# Patient Record
Sex: Male | Born: 1940 | Race: White | Hispanic: No | Marital: Married | Smoking: Never smoker
Health system: Southern US, Community
[De-identification: ages and names within clinical notes are randomized; demographics above are authoritative.]

## PROBLEM LIST (undated history)

## (undated) DIAGNOSIS — E079 Disorder of thyroid, unspecified: Secondary | ICD-10-CM

## (undated) DIAGNOSIS — C801 Malignant (primary) neoplasm, unspecified: Secondary | ICD-10-CM

## (undated) HISTORY — PX: GASTROSTOMY W/ FEEDING TUBE: SUR642

## (undated) HISTORY — PX: TRACHEOSTOMY: SUR1362

---

## 2016-01-17 ENCOUNTER — Encounter (HOSPITAL_BASED_OUTPATIENT_CLINIC_OR_DEPARTMENT_OTHER): Payer: Self-pay | Admitting: Emergency Medicine

## 2016-01-17 ENCOUNTER — Emergency Department (HOSPITAL_BASED_OUTPATIENT_CLINIC_OR_DEPARTMENT_OTHER)
Admission: EM | Admit: 2016-01-17 | Discharge: 2016-01-17 | Disposition: A | Discharge status: Home / Self Care | Payer: Medicare Other | Attending: Emergency Medicine | Admitting: Emergency Medicine

## 2016-01-17 ENCOUNTER — Emergency Department (HOSPITAL_BASED_OUTPATIENT_CLINIC_OR_DEPARTMENT_OTHER): Payer: Medicare Other

## 2016-01-17 DIAGNOSIS — Z792 Long term (current) use of antibiotics: Secondary | ICD-10-CM | POA: Insufficient documentation

## 2016-01-17 DIAGNOSIS — R05 Cough: Secondary | ICD-10-CM | POA: Diagnosis not present

## 2016-01-17 DIAGNOSIS — Z79899 Other long term (current) drug therapy: Secondary | ICD-10-CM | POA: Insufficient documentation

## 2016-01-17 DIAGNOSIS — Z859 Personal history of malignant neoplasm, unspecified: Secondary | ICD-10-CM | POA: Diagnosis not present

## 2016-01-17 DIAGNOSIS — Z79891 Long term (current) use of opiate analgesic: Secondary | ICD-10-CM | POA: Diagnosis not present

## 2016-01-17 DIAGNOSIS — R509 Fever, unspecified: Secondary | ICD-10-CM | POA: Diagnosis present

## 2016-01-17 DIAGNOSIS — R0981 Nasal congestion: Secondary | ICD-10-CM | POA: Diagnosis not present

## 2016-01-17 DIAGNOSIS — R059 Cough, unspecified: Secondary | ICD-10-CM

## 2016-01-17 HISTORY — DX: Malignant (primary) neoplasm, unspecified: C80.1

## 2016-01-17 HISTORY — DX: Disorder of thyroid, unspecified: E07.9

## 2016-01-17 LAB — CBC WITH DIFFERENTIAL/PLATELET
BASOS ABS: 0 10*3/uL (ref 0.0–0.1)
BASOS PCT: 0 %
EOS ABS: 0.1 10*3/uL (ref 0.0–0.7)
Eosinophils Relative: 4 %
HCT: 30 % — ABNORMAL LOW (ref 39.0–52.0)
HEMOGLOBIN: 9.7 g/dL — AB (ref 13.0–17.0)
Lymphocytes Relative: 12 %
Lymphs Abs: 0.5 10*3/uL — ABNORMAL LOW (ref 0.7–4.0)
MCH: 31.5 pg (ref 26.0–34.0)
MCHC: 32.3 g/dL (ref 30.0–36.0)
MCV: 97.4 fL (ref 78.0–100.0)
Monocytes Absolute: 0.3 10*3/uL (ref 0.1–1.0)
Monocytes Relative: 8 %
NEUTROS PCT: 76 %
Neutro Abs: 3.1 10*3/uL (ref 1.7–7.7)
Platelets: 176 10*3/uL (ref 150–400)
RBC: 3.08 MIL/uL — AB (ref 4.22–5.81)
RDW: 12 % (ref 11.5–15.5)
WBC: 4.1 10*3/uL (ref 4.0–10.5)

## 2016-01-17 LAB — BASIC METABOLIC PANEL
Anion gap: 5 (ref 5–15)
BUN: 31 mg/dL — ABNORMAL HIGH (ref 6–20)
CHLORIDE: 97 mmol/L — AB (ref 101–111)
CO2: 33 mmol/L — ABNORMAL HIGH (ref 22–32)
CREATININE: 1.03 mg/dL (ref 0.61–1.24)
Calcium: 9 mg/dL (ref 8.9–10.3)
GFR calc non Af Amer: >60 mL/min (ref >60)
Glucose, Bld: 131 mg/dL — ABNORMAL HIGH (ref 65–99)
POTASSIUM: 4.3 mmol/L (ref 3.5–5.1)
SODIUM: 135 mmol/L (ref 135–145)

## 2016-01-17 NOTE — ED Provider Notes (Signed)
[facility] DEPT [facility] Provider Note   CSN: [id number] Arrival date & time: 01/17/16  1126     History   Chief Complaint Chief Complaint  Patient presents with  . Fever    HPI Cristian Harding is a 75 y.o. male.  The history is provided by the patient.  Fever   This is a new problem. The current episode started 2 days ago. The problem occurs constantly. The problem has been resolved. The maximum temperature noted was 100 to 100.9 F. The temperature was taken using an oral thermometer. Associated symptoms include congestion (improving) and cough. Pertinent negatives include no chest pain, no diarrhea, no vomiting, no headaches and no sore throat. He has tried acetaminophen for the symptoms. The treatment provided significant relief.   Pt treated for pneumonia by PMD 2 weeks ago, completed 6 of 9 days of antibiotics before having to stop the medications as they were clogged his feeding tube.  Past few days pneumonia symptoms have returned per pt and wife (fever and increased brown mucous), which have significantly improved as of today.  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  Past Medical History:  Diagnosis Date  . Cancer ([place])   . Thyroid disease     There are no active problems to display for this patient.   Past Surgical History:  Procedure Laterality Date  . GASTROSTOMY W/ FEEDING TUBE    . TRACHEOSTOMY         Home Medications    Prior to Admission medications   Medication Sig Start Date End Date Taking? Authorizing Provider  acetaminophen (TYLENOL) 160 MG/5ML liquid Take by mouth every 4 (four) hours as needed for fever.   Yes Historical Provider, MD  ciprofloxacin (CIPRO) 500 MG/5ML (10%) suspension Take 500 mg by mouth 2 (two) times daily.   Yes Historical Provider, MD  diazepam (VALIUM) 10 MG tablet Take 10 mg by mouth 3 (three) times daily.   Yes Historical Provider, MD  dicyclomine  (BENTYL) 10 MG capsule Take 10 mg by mouth 4 (four) times daily -  before meals and at bedtime.   Yes Historical Provider, MD  fentaNYL (DURAGESIC - DOSED MCG/HR) 75 MCG/HR Place 75 mcg onto the skin every 3 (three) days.   Yes Historical Provider, MD  glycopyrrolate (ROBINUL) 1 MG tablet Take 1 mg by mouth 2 (two) times daily.   Yes Historical Provider, MD  levothyroxine (SYNTHROID, LEVOTHROID) 75 MCG tablet Take 75 mcg by mouth daily before breakfast.   Yes Historical Provider, MD  metoCLOPramide (REGLAN) 5 MG/5ML solution Take by mouth 4 (four) times daily -  before meals and at bedtime.   Yes Historical Provider, MD  morphine 10 MG/5ML solution Take by mouth 3 (three) times daily as needed for severe pain.   Yes Historical Provider, MD  promethazine (PHENERGAN) 25 MG tablet Take 25 mg by mouth 2 (two) times daily at 10 am and 4 pm.   Yes Historical Provider, MD  traZODone (DESYREL) 50 MG tablet Take 50 mg by mouth at bedtime.   Yes Historical Provider, MD    Family History No family history on file.  Social History Social History  Substance Use Topics  . Smoking status: Never Smoker  . Smokeless tobacco: Never Used  . Alcohol use No     Allergies   Review of patient's allergies indicates no known allergies.   Review of Systems Review of Systems  Constitutional: Positive for fever. Negative for chills.  HENT: Positive for congestion (improving). Negative for ear pain and sore throat.   Eyes: Negative for pain and visual disturbance.  Respiratory: Positive for cough. Negative for shortness of breath.   Cardiovascular: Negative for chest pain and palpitations.  Gastrointestinal: Negative for abdominal pain, diarrhea and vomiting.  Genitourinary: Negative for dysuria and hematuria.  Musculoskeletal: Negative for arthralgias and back pain.  Skin: Negative for color change and rash.  Neurological: Negative for seizures, syncope and headaches.  All other systems reviewed and are  negative.    Physical Exam Updated Vital Signs BP 115/64 (BP Location: Left Arm)   Pulse 67   Temp 99.1 F (37.3 C) (Rectal)   Resp 16   Ht 5\' 9"  (1.753 m)   Wt 158 lb (71.7 kg)   SpO2 100%   BMI 23.33 kg/m   Physical Exam  Constitutional: He is oriented to person, place, and time. He appears well-developed and well-nourished. No distress.  HENT:  Head: Normocephalic and atraumatic.  Nose: Nose normal.  Eyes: Conjunctivae and EOM are normal. Pupils are equal, round, and reactive to light. Right eye exhibits no discharge. Left eye exhibits no discharge. No scleral icterus.  Neck: Normal range of motion. Neck supple.    Cardiovascular: Normal rate and regular rhythm.  Exam reveals no gallop and no friction rub.   No murmur heard. Pulmonary/Chest: Effort normal. No stridor. No respiratory distress. He has no decreased breath sounds. He has no wheezes. He has no rhonchi. He has rales in the right upper field and the left upper field.  Abdominal: Soft. He exhibits no distension. There is no tenderness.  Musculoskeletal: He exhibits no edema or tenderness.  Neurological: He is alert and oriented to person, place, and time.  Skin: Skin is warm and dry. No rash noted. He is not diaphoretic. No erythema.  Psychiatric: He has a normal mood and affect.  Vitals reviewed.    ED Treatments / Results  Labs (all labs ordered are listed, but only abnormal results are displayed) Labs Reviewed  CBC WITH DIFFERENTIAL/PLATELET - Abnormal; Notable for the following:       Result Value   RBC 3.08 (*)    Hemoglobin 9.7 (*)    HCT 30.0 (*)    Lymphs Abs 0.5 (*)    All other components within normal limits  BASIC METABOLIC PANEL - Abnormal; Notable for the following:    Chloride 97 (*)    CO2 33 (*)    Glucose, Bld 131 (*)    BUN 31 (*)    All other components within normal limits    EKG  EKG Interpretation None       Radiology Dg Chest 2 View  Result Date:  01/17/2016 CLINICAL DATA:  Treated for pneumonia 2 weeks ago, symptoms return last few days. EXAM: CHEST  2 VIEW COMPARISON:  None. FINDINGS: Cardiomediastinal silhouette is unremarkable. There is patchy streaky infiltrate in right upper lobe. Findings suspicious for recurrent pneumonia. Follow-up to resolution is recommended. Degenerative changes thoracic spine. IMPRESSION: There is patchy streaky infiltrate in right upper lobe. Findings suspicious for recurrent pneumonia. Follow-up to resolution is recommended. Electronically Signed   By: Lahoma Crocker M.D.   On: 01/17/2016 12:26    Procedures Procedures (including critical care time)  Medications Ordered in ED Medications - No data to display   Initial Impression / Assessment and Plan / ED Course  I have reviewed the triage vital signs and  the nursing notes.  Pertinent labs & imaging results that were available during my care of the patient were reviewed by me and considered in my medical decision making (see chart for details).  Clinical Course    Chest x-ray with right upper lobe opacity which is consistent with the patient's previously known pneumonia that was treated with Cipro. Labs are reassuring without leukocytosis. Patient without a fever, well-appearing, well-hydrated. Given the fact that his symptoms have improved, the patient, his family, and I discussed treatment options. We've shared decision-making we chose not to pursue antibiotic treatment at this time. Patient was given strict return precautions and instructed to follow up closely with his primary care provider.  The patient is safe for discharge with strict return precautions.   Final Clinical Impressions(s) / ED Diagnoses   Final diagnoses:  Cough   Disposition: Discharge  Condition: Good  I have discussed the results, Dx and Tx plan with the patient who expressed understanding and agree(s) with the plan. Discharge instructions discussed at great length. The  patient was given strict return precautions who verbalized understanding of the instructions. No further questions at time of discharge.    Discharge Medication List as of 01/17/2016  3:07 PM      Follow Up: Charleston Poot, MD [address] [place] [address] [phone]  Schedule an appointment as soon as possible for a visit  in 3-5 days, If symptoms do not improve or  worsen      Fatima Blank, MD 01/17/16 1643

## 2016-01-17 NOTE — ED Triage Notes (Signed)
Pt treated for pneumonia by PMD 2 weeks ago, completed 6 of 9 days of antibiotics before having to stop the medications as they were clogged his feeding tube.  Past few days pneumonia symptoms have returned per pt and wife (fever and increased brown mucous).

## 2016-01-28 ENCOUNTER — Inpatient Hospital Stay (HOSPITAL_BASED_OUTPATIENT_CLINIC_OR_DEPARTMENT_OTHER)
Admission: EM | Admit: 2016-01-28 | Discharge: 2016-02-04 | DRG: 194 | Disposition: A | Discharge status: Home Health | Payer: Medicare Other | Attending: Family Medicine | Admitting: Family Medicine

## 2016-01-28 ENCOUNTER — Encounter (HOSPITAL_BASED_OUTPATIENT_CLINIC_OR_DEPARTMENT_OTHER): Payer: Self-pay | Admitting: Emergency Medicine

## 2016-01-28 ENCOUNTER — Emergency Department (HOSPITAL_BASED_OUTPATIENT_CLINIC_OR_DEPARTMENT_OTHER): Payer: Medicare Other

## 2016-01-28 DIAGNOSIS — I712 Thoracic aortic aneurysm, without rupture: Secondary | ICD-10-CM | POA: Diagnosis present

## 2016-01-28 DIAGNOSIS — Z85819 Personal history of malignant neoplasm of unspecified site of lip, oral cavity, and pharynx: Secondary | ICD-10-CM

## 2016-01-28 DIAGNOSIS — Z931 Gastrostomy status: Secondary | ICD-10-CM | POA: Diagnosis not present

## 2016-01-28 DIAGNOSIS — Z930 Tracheostomy status: Secondary | ICD-10-CM | POA: Diagnosis not present

## 2016-01-28 DIAGNOSIS — Z9221 Personal history of antineoplastic chemotherapy: Secondary | ICD-10-CM | POA: Diagnosis not present

## 2016-01-28 DIAGNOSIS — R339 Retention of urine, unspecified: Secondary | ICD-10-CM

## 2016-01-28 DIAGNOSIS — K029 Dental caries, unspecified: Secondary | ICD-10-CM | POA: Diagnosis present

## 2016-01-28 DIAGNOSIS — R059 Cough, unspecified: Secondary | ICD-10-CM

## 2016-01-28 DIAGNOSIS — K9423 Gastrostomy malfunction: Secondary | ICD-10-CM

## 2016-01-28 DIAGNOSIS — J189 Pneumonia, unspecified organism: Secondary | ICD-10-CM | POA: Diagnosis not present

## 2016-01-28 DIAGNOSIS — Z79899 Other long term (current) drug therapy: Secondary | ICD-10-CM

## 2016-01-28 DIAGNOSIS — I10 Essential (primary) hypertension: Secondary | ICD-10-CM | POA: Diagnosis present

## 2016-01-28 DIAGNOSIS — N179 Acute kidney failure, unspecified: Secondary | ICD-10-CM | POA: Diagnosis not present

## 2016-01-28 DIAGNOSIS — Z923 Personal history of irradiation: Secondary | ICD-10-CM

## 2016-01-28 DIAGNOSIS — Z792 Long term (current) use of antibiotics: Secondary | ICD-10-CM | POA: Diagnosis not present

## 2016-01-28 DIAGNOSIS — I719 Aortic aneurysm of unspecified site, without rupture: Secondary | ICD-10-CM | POA: Diagnosis present

## 2016-01-28 DIAGNOSIS — R911 Solitary pulmonary nodule: Secondary | ICD-10-CM | POA: Diagnosis present

## 2016-01-28 DIAGNOSIS — E860 Dehydration: Secondary | ICD-10-CM | POA: Diagnosis present

## 2016-01-28 DIAGNOSIS — R0600 Dyspnea, unspecified: Secondary | ICD-10-CM | POA: Diagnosis not present

## 2016-01-28 DIAGNOSIS — Y95 Nosocomial condition: Secondary | ICD-10-CM | POA: Diagnosis present

## 2016-01-28 DIAGNOSIS — R05 Cough: Secondary | ICD-10-CM

## 2016-01-28 DIAGNOSIS — R0902 Hypoxemia: Secondary | ICD-10-CM | POA: Diagnosis present

## 2016-01-28 DIAGNOSIS — R112 Nausea with vomiting, unspecified: Secondary | ICD-10-CM | POA: Diagnosis present

## 2016-01-28 DIAGNOSIS — R1110 Vomiting, unspecified: Secondary | ICD-10-CM

## 2016-01-28 DIAGNOSIS — R52 Pain, unspecified: Secondary | ICD-10-CM

## 2016-01-28 DIAGNOSIS — G43A0 Cyclical vomiting, not intractable: Secondary | ICD-10-CM | POA: Diagnosis not present

## 2016-01-28 LAB — BASIC METABOLIC PANEL
ANION GAP: 5 (ref 5–15)
BUN: 21 mg/dL — ABNORMAL HIGH (ref 6–20)
CHLORIDE: 98 mmol/L — AB (ref 101–111)
CO2: 35 mmol/L — ABNORMAL HIGH (ref 22–32)
Calcium: 9.5 mg/dL (ref 8.9–10.3)
Creatinine, Ser: 0.89 mg/dL (ref 0.61–1.24)
Glucose, Bld: 140 mg/dL — ABNORMAL HIGH (ref 65–99)
POTASSIUM: 4.1 mmol/L (ref 3.5–5.1)
SODIUM: 138 mmol/L (ref 135–145)

## 2016-01-28 LAB — I-STAT ARTERIAL BLOOD GAS, ED
Acid-Base Excess: 10 mmol/L — ABNORMAL HIGH (ref 0.0–2.0)
Bicarbonate: 33.7 mmol/L — ABNORMAL HIGH (ref 20.0–28.0)
O2 Saturation: 98 %
PCO2 ART: 39.1 mmHg (ref 32.0–48.0)
PH ART: 7.543 — AB (ref 7.350–7.450)
TCO2: 35 mmol/L (ref 0–100)
pO2, Arterial: 98 mmHg (ref 83.0–108.0)

## 2016-01-28 LAB — CBC WITH DIFFERENTIAL/PLATELET
BASOS ABS: 0 10*3/uL (ref 0.0–0.1)
BASOS PCT: 0 %
EOS ABS: 0.1 10*3/uL (ref 0.0–0.7)
Eosinophils Relative: 2 %
HCT: 30 % — ABNORMAL LOW (ref 39.0–52.0)
HEMOGLOBIN: 9.7 g/dL — AB (ref 13.0–17.0)
LYMPHS ABS: 0.5 10*3/uL — AB (ref 0.7–4.0)
Lymphocytes Relative: 17 %
MCH: 31.5 pg (ref 26.0–34.0)
MCHC: 32.3 g/dL (ref 30.0–36.0)
MCV: 97.4 fL (ref 78.0–100.0)
Monocytes Absolute: 0.4 10*3/uL (ref 0.1–1.0)
Monocytes Relative: 11 %
NEUTROS PCT: 70 %
Neutro Abs: 2.3 10*3/uL (ref 1.7–7.7)
Platelets: 153 10*3/uL (ref 150–400)
RBC: 3.08 MIL/uL — AB (ref 4.22–5.81)
RDW: 11.6 % (ref 11.5–15.5)
WBC: 3.3 10*3/uL — AB (ref 4.0–10.5)

## 2016-01-28 LAB — MRSA PCR SCREENING: MRSA by PCR: POSITIVE — AB

## 2016-01-28 LAB — TROPONIN I

## 2016-01-28 MED ORDER — DIAZEPAM 5 MG PO TABS: 10.0000 mg | ORAL_TABLET | Freq: Three times a day (TID) | ORAL | Status: DC

## 2016-01-28 MED ORDER — METOCLOPRAMIDE HCL 5 MG/5ML PO SOLN
5.0000 mg | Freq: Three times a day (TID) | ORAL | Status: DC
  Filled 2016-01-28 (×2): qty 5

## 2016-01-28 MED ORDER — MORPHINE SULFATE 10 MG/5ML PO SOLN: 10.0000 mg | Freq: Three times a day (TID) | ORAL | Status: DC | PRN

## 2016-01-28 MED ORDER — TRAZODONE HCL 50 MG PO TABS
100.0000 mg | ORAL_TABLET | Freq: Every day | ORAL | Status: DC
  Administered 2016-01-28 – 2016-02-03 (×7): 100 mg via JEJUNOSTOMY
  Filled 2016-01-28 (×6): qty 2

## 2016-01-28 MED ORDER — DIAZEPAM 5 MG PO TABS
10.0000 mg | ORAL_TABLET | Freq: Three times a day (TID) | ORAL | Status: DC
  Filled 2016-01-28: qty 2

## 2016-01-28 MED ORDER — METOCLOPRAMIDE HCL 5 MG/5ML PO SOLN
10.0000 mg | Freq: Three times a day (TID) | ORAL | Status: DC
  Administered 2016-01-29 – 2016-02-04 (×20): 10 mg via ORAL
  Filled 2016-01-28 (×21): qty 10

## 2016-01-28 MED ORDER — PROMETHAZINE HCL 25 MG PO TABS: 25.0000 mg | ORAL_TABLET | Freq: Two times a day (BID) | ORAL | Status: DC

## 2016-01-28 MED ORDER — ENOXAPARIN SODIUM 40 MG/0.4ML ~~LOC~~ SOLN
40.0000 mg | SUBCUTANEOUS | Status: DC
  Administered 2016-01-28 – 2016-02-03 (×7): 40 mg via SUBCUTANEOUS
  Filled 2016-01-28 (×7): qty 0.4

## 2016-01-28 MED ORDER — TRAZODONE HCL 50 MG PO TABS
50.0000 mg | ORAL_TABLET | Freq: Every day | ORAL | Status: DC
  Filled 2016-01-28: qty 1

## 2016-01-28 MED ORDER — SODIUM CHLORIDE 0.9 % IV SOLN
1500.0000 mg | Freq: Once | INTRAVENOUS | Status: AC
  Administered 2016-01-28: 1500 mg via INTRAVENOUS
  Filled 2016-01-28: qty 1500

## 2016-01-28 MED ORDER — IPRATROPIUM-ALBUTEROL 0.5-2.5 (3) MG/3ML IN SOLN
3.0000 mL | Freq: Four times a day (QID) | RESPIRATORY_TRACT | Status: DC
  Administered 2016-01-28 – 2016-01-29 (×5): 3 mL via RESPIRATORY_TRACT
  Filled 2016-01-28 (×5): qty 3

## 2016-01-28 MED ORDER — LEVOTHYROXINE SODIUM 100 MCG IV SOLR
50.0000 ug | Freq: Every day | INTRAVENOUS | Status: DC
  Administered 2016-01-29 – 2016-02-04 (×7): 50 ug via INTRAVENOUS
  Filled 2016-01-28 (×7): qty 5

## 2016-01-28 MED ORDER — GLYCOPYRROLATE 1 MG PO TABS
1.0000 mg | ORAL_TABLET | Freq: Two times a day (BID) | ORAL | Status: DC
  Administered 2016-01-28 – 2016-02-04 (×14): 1 mg via ORAL
  Filled 2016-01-28 (×15): qty 1

## 2016-01-28 MED ORDER — FENTANYL 75 MCG/HR TD PT72
75.0000 ug | MEDICATED_PATCH | TRANSDERMAL | Status: DC
  Filled 2016-01-28: qty 1

## 2016-01-28 MED ORDER — DIAZEPAM 5 MG PO TABS
10.0000 mg | ORAL_TABLET | Freq: Three times a day (TID) | ORAL | Status: DC | PRN
  Administered 2016-01-28 – 2016-02-04 (×17): 10 mg
  Filled 2016-01-28 (×16): qty 2

## 2016-01-28 MED ORDER — IPRATROPIUM-ALBUTEROL 0.5-2.5 (3) MG/3ML IN SOLN
3.0000 mL | Freq: Once | RESPIRATORY_TRACT | Status: AC
  Administered 2016-01-28: 3 mL via RESPIRATORY_TRACT
  Filled 2016-01-28: qty 3

## 2016-01-28 MED ORDER — MORPHINE SULFATE (PF) 2 MG/ML IV SOLN
1.0000 mg | INTRAVENOUS | Status: DC | PRN
  Administered 2016-01-28 – 2016-02-01 (×9): 1 mg via INTRAVENOUS
  Filled 2016-01-28 (×9): qty 1

## 2016-01-28 MED ORDER — DIAZEPAM 5 MG PO TABS: 5.0000 mg | ORAL_TABLET | Freq: Once | ORAL | Status: DC

## 2016-01-28 MED ORDER — DICYCLOMINE HCL 10 MG PO CAPS
10.0000 mg | ORAL_CAPSULE | Freq: Three times a day (TID) | ORAL | Status: DC
  Filled 2016-01-28: qty 1

## 2016-01-28 MED ORDER — PANTOPRAZOLE SODIUM 40 MG PO PACK
40.0000 mg | PACK | Freq: Every day | ORAL | Status: DC
  Administered 2016-01-28 – 2016-02-04 (×8): 40 mg
  Filled 2016-01-28 (×8): qty 20

## 2016-01-28 MED ORDER — POLYETHYLENE GLYCOL 3350 17 G PO PACK: 17.0000 g | PACK | Freq: Every day | ORAL | Status: DC | PRN

## 2016-01-28 MED ORDER — ACETAMINOPHEN 160 MG/5ML PO SOLN
325.0000 mg | ORAL | Status: DC | PRN
Start: 2016-01-28 — End: 2016-01-28

## 2016-01-28 MED ORDER — LORAZEPAM 2 MG/ML IJ SOLN
1.0000 mg | Freq: Once | INTRAMUSCULAR | Status: AC
  Administered 2016-01-28: 1 mg via INTRAVENOUS
  Filled 2016-01-28: qty 1

## 2016-01-28 MED ORDER — HYDRALAZINE HCL 20 MG/ML IJ SOLN
10.0000 mg | Freq: Three times a day (TID) | INTRAMUSCULAR | Status: DC | PRN
  Administered 2016-01-28 – 2016-01-31 (×5): 10 mg via INTRAVENOUS
  Filled 2016-01-28 (×5): qty 1

## 2016-01-28 MED ORDER — ONDANSETRON HCL 4 MG/2ML IJ SOLN
4.0000 mg | Freq: Four times a day (QID) | INTRAMUSCULAR | Status: DC | PRN
  Administered 2016-01-29 – 2016-02-03 (×7): 4 mg via INTRAVENOUS
  Filled 2016-01-28 (×7): qty 2

## 2016-01-28 MED ORDER — LEVOTHYROXINE SODIUM 75 MCG PO TABS: 75.0000 ug | ORAL_TABLET | Freq: Every day | ORAL | Status: DC

## 2016-01-28 MED ORDER — TRAZODONE HCL 50 MG PO TABS: 50.0000 mg | ORAL_TABLET | Freq: Every day | ORAL | Status: DC

## 2016-01-28 MED ORDER — VANCOMYCIN HCL IN DEXTROSE 1-5 GM/200ML-% IV SOLN
1000.0000 mg | Freq: Two times a day (BID) | INTRAVENOUS | Status: DC
  Administered 2016-01-29 – 2016-01-30 (×4): 1000 mg via INTRAVENOUS
  Filled 2016-01-28 (×4): qty 200

## 2016-01-28 MED ORDER — IOPAMIDOL (ISOVUE-300) INJECTION 61%
80.0000 mL | Freq: Once | INTRAVENOUS | Status: AC | PRN
  Administered 2016-01-28: 80 mL via INTRAVENOUS

## 2016-01-28 MED ORDER — ACETAMINOPHEN 160 MG/5ML PO SOLN: 325.0000 mg | ORAL | Status: DC | PRN

## 2016-01-28 MED ORDER — PIPERACILLIN-TAZOBACTAM 3.375 G IVPB
3.3750 g | Freq: Three times a day (TID) | INTRAVENOUS | Status: DC
  Administered 2016-01-28 – 2016-01-31 (×8): 3.375 g via INTRAVENOUS
  Filled 2016-01-28 (×8): qty 50

## 2016-01-28 NOTE — ED Notes (Signed)
Patient transported to CT 

## 2016-01-28 NOTE — Progress Notes (Signed)
Called by ER physician at [facility].  75 year old male with past mental history of neck cancer status post radiation and chemotherapy now with tracheostomy and PEG which has been overall stable up until 2 weeks ago r. In that time he is also developed cough which continued to get worse. He was seen by his PCP and started on antibiotics, however after 6/9 courses, medications were not passing through his feeding tube. Pneumonia symptoms which had improved once antibiotics were started, returned approximately 2 days ago. Patient came in the emergency room for further evaluation. At that time he was noted to be hypoxic with oxygen saturations at 75%.  During emergency room evaluation, it was noted that patient was having significant vomiting, which ER suspected possibly from feeding tube or gastric outlet obstruction? One of his episodes was very blood-tinged. Patient required oxygen to keep oxygen saturations elevated. It was agreed patient would need stepdown status. Case was discussed with critical care hospitalist will admit once patient is transferred to [facility]

## 2016-01-28 NOTE — ED Provider Notes (Signed)
[facility] DEPT [facility] Provider Note   CSN: [id number] Arrival date & time: 01/28/16  1042     History   Chief Complaint Chief Complaint  Patient presents with  . Follow-up  . Cough    HPI Cristian Harding is a 75 y.o. male.  The history is provided by the patient. No language interpreter was used.  Cough     Cristian Harding is a 75 y.o. male who presents to the Emergency Department complaining of cough.  He reports 2 weeks of progressive cough and shortness of breath. 2 weeks ago he was seen by his PCP and started on antibiotics and had a recheck in the emergency department for fever was continued on his antibiotics. He had improved only to develop worsening symptoms 3-4 days ago. He reports a worsening cough productive of thick and white sputum. He has increased fatigue and shortness of breath. He denies any fevers, chest pain, leg swelling or pain. He has a history of throat cancer status post radiation that was complicated with tracheostomy and PEG tube placement. He uses a PMV and is on humidified air at home.  Past Medical History:  Diagnosis Date  . Cancer ([place])   . Thyroid disease     Patient Active Problem List   Diagnosis Date Noted  . Hypoxia 01/28/2016    Past Surgical History:  Procedure Laterality Date  . GASTROSTOMY W/ FEEDING TUBE    . TRACHEOSTOMY         Home Medications    Prior to Admission medications   Medication Sig Start Date End Date Taking? Authorizing Provider  acetaminophen (TYLENOL) 160 MG/5ML liquid Take by mouth every 4 (four) hours as needed for fever.    Historical Provider, MD  ciprofloxacin (CIPRO) 500 MG/5ML (10%) suspension Take 500 mg by mouth 2 (two) times daily.    Historical Provider, MD  diazepam (VALIUM) 10 MG tablet Take 10 mg by mouth 3 (three) times daily.    Historical Provider, MD  dicyclomine (BENTYL) 10 MG capsule Take 10 mg by mouth 4 (four) times daily -  before meals and at bedtime.    Historical Provider, MD    fentaNYL (DURAGESIC - DOSED MCG/HR) 75 MCG/HR Place 75 mcg onto the skin every 3 (three) days.    Historical Provider, MD  glycopyrrolate (ROBINUL) 1 MG tablet Take 1 mg by mouth 2 (two) times daily.    Historical Provider, MD  levothyroxine (SYNTHROID, LEVOTHROID) 75 MCG tablet Take 75 mcg by mouth daily before breakfast.    Historical Provider, MD  metoCLOPramide (REGLAN) 5 MG/5ML solution Take by mouth 4 (four) times daily -  before meals and at bedtime.    Historical Provider, MD  morphine 10 MG/5ML solution Take by mouth 3 (three) times daily as needed for severe pain.    Historical Provider, MD  promethazine (PHENERGAN) 25 MG tablet Take 25 mg by mouth 2 (two) times daily at 10 am and 4 pm.    Historical Provider, MD  traZODone (DESYREL) 50 MG tablet Take 50 mg by mouth at bedtime.    Historical Provider, MD    Family History No family history on file.  Social History Social History  Substance Use Topics  . Smoking status: Never Smoker  . Smokeless tobacco: Never Used  . Alcohol use No     Allergies   Review of patient's allergies indicates no known allergies.   Review of Systems Review of Systems  Respiratory: Positive for cough.   All other  systems reviewed and are negative.    Physical Exam Updated Vital Signs BP 160/95 (BP Location: Right Arm)   Pulse 73   Temp 98.3 F (36.8 C) (Oral)   Resp 18   Ht 5\' 9"  (1.753 m)   Wt 158 lb (71.7 kg)   SpO2 96%   BMI 23.33 kg/m   Physical Exam  Constitutional: He is oriented to person, place, and time. He appears well-developed and well-nourished.  HENT:  Head: Normocephalic and atraumatic.  Neck:  Tracheostomy and anterior neck with PMV in place.  Cardiovascular: Normal rate and regular rhythm.   No murmur heard. Pulmonary/Chest: Effort normal. No respiratory distress.  Occasional rhonchi bilaterally  Abdominal: Soft. There is no tenderness. There is no rebound and no guarding.  Musculoskeletal: He exhibits no  edema or tenderness.  Neurological: He is alert and oriented to person, place, and time.  Skin: Skin is warm and dry. There is pallor.  Psychiatric: He has a normal mood and affect. His behavior is normal.  Nursing note and vitals reviewed.    ED Treatments / Results  Labs (all labs ordered are listed, but only abnormal results are displayed) Labs Reviewed  CBC WITH DIFFERENTIAL/PLATELET - Abnormal; Notable for the following:       Result Value   WBC 3.3 (*)    RBC 3.08 (*)    Hemoglobin 9.7 (*)    HCT 30.0 (*)    Lymphs Abs 0.5 (*)    All other components within normal limits  BASIC METABOLIC PANEL - Abnormal; Notable for the following:    Chloride 98 (*)    CO2 35 (*)    Glucose, Bld 140 (*)    BUN 21 (*)    All other components within normal limits  I-STAT ARTERIAL BLOOD GAS, ED - Abnormal; Notable for the following:    pH, Arterial 7.543 (*)    Bicarbonate 33.7 (*)    Acid-Base Excess 10.0 (*)    All other components within normal limits  TROPONIN I    EKG  EKG Interpretation  Date/Time:  Wednesday January 28 2016 12:09:56 EDT Ventricular Rate:  65 PR Interval:    QRS Duration: 71 QT Interval:  404 QTC Calculation: 420 R Axis:   60 Text Interpretation:  Sinus rhythm Abnormal R-wave progression, early transition Borderline ST elevation, inferior leads , baseline wander Confirmed by Hazle Coca [phone]) on 01/28/2016 12:24:10 PM       Radiology Dg Chest 2 View  Result Date: 01/28/2016 CLINICAL DATA:  Pneumonia 2 weeks ago EXAM: CHEST  2 VIEW COMPARISON:  01/17/2016 FINDINGS: Patchy airspace disease in the right upper lobe has nearly resolved. There is minimal ill-defined opacities remaining. Lungs otherwise clear. Low volumes. Normal heart size. No pneumothorax or pleural effusion. IMPRESSION: Nearly resolved right upper lobe pneumonia. This supports benign etiology. Electronically Signed   By: Marybelle Killings M.D.   On: 01/28/2016 11:57   Ct Chest W  Contrast  Result Date: 01/28/2016 CLINICAL DATA:  Reflux with concern for aspiration. Prior tracheostomy. EXAM: CT CHEST WITH CONTRAST TECHNIQUE: Multidetector CT imaging of the chest was performed during intravenous contrast administration. CONTRAST:  75mL ISOVUE-300 IOPAMIDOL (ISOVUE-300) INJECTION 61% COMPARISON:  Chest radiograph January 28, 2016 FINDINGS: Cardiovascular: There is prominence of the ascending thoracic aorta with a measured transverse diameter of 4.4 x 4.4 cm. No thoracic aortic dissection is seen. There are scattered foci of calcification in the aorta. There is mild calcification in the proximal great vessels as  well. There are foci of coronary artery calcification evident. The pericardium is not appreciably thickened. No major vessel pulmonary embolus is demonstrated. Mediastinum/Nodes: Tracheostomy catheter is positioned with the tip in the mid trachea. Thyroid is diminutive without mass evident. There are scattered subcentimeter mediastinal lymph nodes. There is a focal lymph node anterior to the carina on the right measuring 1.4 x 1.2 cm. There is a sub- carinal lymph node measuring 2.0 x 1.7 cm. No other lymph node prominence is evident in the thoracic region. Lungs/Pleura: There is an azygos lobe on the right, an anatomic variant. There is scarring in the apices with localized bronchiolectasis in the apices medially. There is a semi-solid nodular opacity in the posterior segment of the right upper lobe measuring 1.0 x 0.6 cm, seen on axial slice 52 series 4. There is patchy ground-glass type opacity in the anterior segment of the right upper lobe, measuring 1.2 x 0.7 cm, best appreciated on axial slice 30 series 4. There is mild patchy infiltrate in the superior and medial segments of the left lower lobe. There is mild upper and lower lobe bronchiectatic change bilaterally. Upper Abdomen: In the visualized upper abdomen, there is a gastrostomy catheter extending into the stomach,  incompletely visualized. There are low-attenuation adrenal masses bilaterally. The mass on the right measures 2.5 x 2.2 cm. The mass on the left measures 1.9 x 1.7 cm. Visualized upper abdominal structures otherwise appear unremarkable. 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 There are no blastic or lytic bone lesions. IMPRESSION: Patchy infiltrate left lower lobe. Question aspiration given the clinical history. Ground-glass opacity anterior segment right upper lobe measuring 1.2 x 0.7 cm. Semi-solid nodular opacity posterior segment right upper lobe measuring 1.0 x 0.6 cm. Non-contrast chest CT at 3-6 months is recommended. If nodules persist, subsequent management will be based upon the most suspicious nodule(s). This recommendation follows the consensus statement: Guidelines for Management of Incidental Pulmonary Nodules Detected on CT Images: From the Fleischner Society 2017; Radiology 2017; 284:228-243. Mildly prominent lymph nodes in the precarinal and sub- carinal regions. Question reactive etiology. Scarring with bronchiolectasis in both lung apices. There is bronchiectasis in the upper and lower lobe regions as well. Ascending thoracic aorta measures 4.4 x 4.4 cm. No dissection. Recommend annual imaging followup by CTA or MRA. This recommendation follows 2010 ACCF/AHA/AATS/ACR/ASA/SCA/SCAI/SIR/STS/SVM Guidelines for the Diagnosis and Management of Patients with Thoracic Aortic Disease. Circulation. 2010; [id number]. There are foci of calcification in several carotid arteries. Electronically Signed   By: Lowella Grip III M.D.   On: 01/28/2016 15:34    Procedures Procedures (including critical care time)  Medications Ordered in ED Medications  ipratropium-albuterol (DUONEB) 0.5-2.5 (3) MG/3ML nebulizer solution 3 mL (3 mLs Nebulization Given 01/28/16 1155)  LORazepam (ATIVAN) injection 1 mg (1 mg Intravenous Given 01/28/16 1421)  iopamidol  (ISOVUE-300) 61 % injection 80 mL (80 mLs Intravenous Contrast Given 01/28/16 1448)     Initial Impression / Assessment and Plan / ED Course  I have reviewed the triage vital signs and the nursing notes.  Pertinent labs & imaging results that were available during my care of the patient were reviewed by me and considered in my medical decision making (see chart for details).  Clinical Course  Patient with history of tracheostomy and PEG tube placement here with increased shortness of breath and cough. On initial examination patient in no distress with occasional rhonchi. On repeat assessment patient developed severe cough with diaphoresis. His sats  dropped to the low 70s and he had what appeared to be tube feeds coming from his tracheostomy tube. After aggressive suctioning and frequent coughing the patient was able to clear the material and his oxygen sats improved to the low 90s. His PEG tube was suctioned and 500 mL of tube feed material was returned.  Family requests transfer to [facility] where the patient has had prior Oncology care. [facility] was contacted and they do not have beds available at this time. Discussed the case with critical care physician on-call, Dr. Ashok Cordia, and ABG and CT chest were added on.   Discussed with Dr. Maryland Pink at [facility] who accepts the patient in transfer to stepdown unit.   Following discussed with Dr. Maryland Pink pt developed recurrent but briefer episode of severe cough with hypoxia to 70s, suctioned material appeared to be dark and bloody.     Final Clinical Impressions(s) / ED Diagnoses   Final diagnoses:  None    New Prescriptions New Prescriptions   No medications on file     Quintella Reichert, MD 01/29/16 [phone]

## 2016-01-28 NOTE — Progress Notes (Signed)
Pt Fentanyl pact from home on pt Left Shoulder applied by family on 01/27/2016 @ 1230 pm

## 2016-01-28 NOTE — ED Triage Notes (Addendum)
Pt treated for pneumonia 2 weeks ago.  Seen in ED for follow up.   Pt continues to have productive cough.  PCP would not see pt, sent to ED for evaluation.  No fever.  Pt states he has gotten worse, with sob, cough.  Pt states he has to use suction more frequently.  No acute respiratory distress noted.

## 2016-01-28 NOTE — ED Notes (Signed)
Respiratory called to room with a Sat of 75%. Pt suctioned and noted to have thick, tan , tube feeding smell noted coming out of #6 cufeless [place].

## 2016-01-28 NOTE — ED Notes (Signed)
RT called back to room. SpO2 75% on RA. Pt coughing dark brown color secretions out of trach. Pt suctioned. Placed on 40% ATC

## 2016-01-28 NOTE — Progress Notes (Signed)
Pharmacy Antibiotic Note  Kavir Bagwell is a 75 y.o. male admitted on 01/28/2016 with aspiration PNA.  Pharmacy has been consulted for vancomycin/Zosyn dosing.  Pt has a  hx of throat cancer s/p radiation in remission with trach in place, and dysphagia s/p PEG tube placement who was brought to the ED with cc of dyspnea for the past few days. He has been doing more frequent suctioning recently and has been having worsening cough and hoarseness. No chest pain, fever or chills reported. He was treated recently for a pneumonia from (last month).  Plan: Zosyn 3.375g IV q8h (4 hour infusion).  Vancomycin 1500 mg IV x1, then vancomycin 1000 mg IV q12h  Height: 5\' 9"  (175.3 cm) Weight: 158 lb (71.7 kg) IBW/kg (Calculated) : 70.7  Temp (24hrs), Avg:98.9 F (37.2 C), Min:98.3 F (36.8 C), Max:99.5 F (37.5 C)   Recent Labs Lab 01/28/16 1215  WBC 3.3*  CREATININE 0.89    Estimated Creatinine Clearance: 71.7 mL/min (by C-G formula based on SCr of 0.89 mg/dL).    No Known Allergies  Antimicrobials this admission: vancomycin 11/1 >>  Zosyn 11/1 >>   Dose adjustments this admission: ---  Microbiology results: 11/1 Sputum: sent    Thank you for allowing pharmacy to be a part of this patient's care.  Royetta Asal, PharmD, BCPS Pager [phone] 01/28/2016 8:04 PM

## 2016-01-28 NOTE — ED Notes (Signed)
Suction set up in room. #14 F catheters set up at bedside.

## 2016-01-28 NOTE — H&P (Signed)
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 He has been doing more frequent suctioning recently and has been having worsening cough and hoarseness. No chest pain, fever or chills reported. He was treated recently for a pneumonia from (last month). In the ER, his sat was low to 70s and he had two episodes of vomiting and difficulty passing medications through the PEG tube. There is a suspicion that he has gastric outlet obstruction. In one episode , bloody fluid was aspirated but no coffee ground or clots. No abdominal pain or diarrhea. Last BM was yesterday. He gets nothing orally, all intake is via PEG tube and last meal was this morning.   Review of Systems:  CONSTITUTIONAL:     No night sweats.  No fatigue.  No fever. No chills. Eyes:                            No visual changes.  No eye pain.  No eye discharge.   ENT:                              No epistaxis.  No sinus pain.  No sore throat.   No congestion. RESPIRATORY:           +cough.  No wheeze.  No hemoptysis.  +dyspnea CARDIOVASCULAR   :  No chest pains.  No palpitations. GASTROINTESTINAL:  No abdominal pain.  No nausea. +vomiting.  No diarrhea. No constipation.  No hematemesis.  No hematochezia.  No melena. GENITOURINARY:      No urgency.  No frequency.  No dysuria.  No hematuria.  No obstructive symptoms.  No discharge.  No pain.   MUSCULOSKELETAL:  No musculoskeletal pain.  No joint swelling.  No arthritis. NEUROLOGICAL:        No confusion.  No weakness. No headache. No seizure. PSYCHIATRIC:             No depression. No anxiety. No suicidal ideation. SKIN:                             No rashes.   No lesions.  No wounds. ENDOCRINE:                No weight loss.  No polydipsia.  No polyuria.  No polyphagia. HEMATOLOGIC:           No purpura.  No petechiae.  No bleeding.  ALLERGIC                 : No pruritus.  No angioedema Other:  Past Medical and Surgical History:   Past Medical History:  Diagnosis Date  . Cancer ([place])   . Thyroid disease    Past Surgical History:  Procedure Laterality Date  . GASTROSTOMY W/ FEEDING TUBE    . TRACHEOSTOMY      Social History:   reports that he has never smoked. He has never used smokeless tobacco. He reports that he does not drink alcohol. His drug  history is not on file.   No Known Allergies  No family history on file.    Prior to Admission medications   Medication Sig Start Date End Date Taking? Authorizing Provider  acetaminophen (TYLENOL) 160 MG/5ML liquid Take by mouth every 4 (four) hours as needed for fever.    Historical Provider, MD  ciprofloxacin (CIPRO) 500 MG/5ML (10%) suspension Take 500 mg by mouth 2 (two) times daily.    Historical Provider, MD  diazepam (VALIUM) 10 MG tablet Take 10 mg by mouth 3 (three) times daily.    Historical Provider, MD  dicyclomine (BENTYL) 10 MG capsule Take 10 mg by mouth 4 (four) times daily -  before meals and at bedtime.    Historical Provider, MD  fentaNYL (DURAGESIC - DOSED MCG/HR) 75 MCG/HR Place 75 mcg onto the skin every 3 (three) days.    Historical Provider, MD  glycopyrrolate (ROBINUL) 1 MG tablet Take 1 mg by mouth 2 (two) times daily.    Historical Provider, MD  levothyroxine (SYNTHROID, LEVOTHROID) 75 MCG tablet Take 75 mcg by mouth daily before breakfast.    Historical Provider, MD  metoCLOPramide (REGLAN) 5 MG/5ML solution Take by mouth 4 (four) times daily -  before meals and at bedtime.    Historical Provider, MD  morphine 10 MG/5ML solution Take by mouth 3 (three) times daily as needed for severe pain.    Historical Provider, MD  promethazine (PHENERGAN) 25 MG tablet Take  25 mg by mouth 2 (two) times daily at 10 am and 4 pm.    Historical Provider, MD  traZODone (DESYREL) 50 MG tablet Take 50 mg by mouth at bedtime.    Historical Provider, MD    Physical Exam: BP (!) 191/82   Pulse 72   Temp 98.3 F (36.8 C) (Oral)   Resp 11   Ht 5\' 9"  (1.753 m)   Wt 71.7 kg (158 lb)   SpO2 100%   BMI 23.33 kg/m   GENERAL :   Alert and cooperative, and appears to be in no acute distress. HEAD:           normocephalic. EYES:            PERRL, EOMI.   EARS:           hearing grossly intact. NOSE:           No nasal discharge. NECK:          Trach in place  CARDIAC:    Normal S1 and S2. No gallop. No murmurs.  Vascular:     no peripheral edema.  LUNGS:       Crackles and coarse breathing sounds in LUL ABDOMEN: Positive bowel sounds. Soft, nondistended, nontender. No guarding or rebound.     PEG tube in place  MSK:           No joint erythema or tenderness.  EXT           : No significant deformity or joint abnormality. Neuro        : Alert, oriented to person, place, and time. SKIN:            No rash. No lesions. PSYCH:       No hallucination. Patient is not suicidal.          Labs on Admission:  Reviewed.   Radiological Exams on Admission: Dg Chest 2 View  Result Date: 01/28/2016 CLINICAL DATA:  Pneumonia 2 weeks ago EXAM: CHEST  2 VIEW  COMPARISON:  01/17/2016 FINDINGS: Patchy airspace disease in the right upper lobe has nearly resolved. There is minimal ill-defined opacities remaining. Lungs otherwise clear. Low volumes. Normal heart size. No pneumothorax or pleural effusion. IMPRESSION: Nearly resolved right upper lobe pneumonia. This supports benign etiology. Electronically Signed   By: Marybelle Killings M.D.   On: 01/28/2016 11:57   Ct Chest W Contrast  Result Date: 01/28/2016 CLINICAL DATA:  Reflux with concern for aspiration. Prior tracheostomy. EXAM: CT CHEST WITH CONTRAST TECHNIQUE: Multidetector CT imaging of the chest was performed during intravenous  contrast administration. CONTRAST:  51mL ISOVUE-300 IOPAMIDOL (ISOVUE-300) INJECTION 61% COMPARISON:  Chest radiograph January 28, 2016 FINDINGS: Cardiovascular: There is prominence of the ascending thoracic aorta with a measured transverse diameter of 4.4 x 4.4 cm. No thoracic aortic dissection is seen. There are scattered foci of calcification in the aorta. There is mild calcification in the proximal great vessels as well. There are foci of coronary artery calcification evident. The pericardium is not appreciably thickened. No major vessel pulmonary embolus is demonstrated. Mediastinum/Nodes: Tracheostomy catheter is positioned with the tip in the mid trachea. Thyroid is diminutive without mass evident. There are scattered subcentimeter mediastinal lymph nodes. There is a focal lymph node anterior to the carina on the right measuring 1.4 x 1.2 cm. There is a sub- carinal lymph node measuring 2.0 x 1.7 cm. No other lymph node prominence is evident in the thoracic region. Lungs/Pleura: There is an azygos lobe on the right, an anatomic variant. There is scarring in the apices with localized bronchiolectasis in the apices medially. There is a semi-solid nodular opacity in the posterior segment of the right upper lobe measuring 1.0 x 0.6 cm, seen on axial slice 52 series 4. There is patchy ground-glass type opacity in the anterior segment of the right upper lobe, measuring 1.2 x 0.7 cm, best appreciated on axial slice 30 series 4. There is mild patchy infiltrate in the superior and medial segments of the left lower lobe. There is mild upper and lower lobe bronchiectatic change bilaterally. Upper Abdomen: In the visualized upper abdomen, there is a gastrostomy catheter extending into the stomach, incompletely visualized. There are low-attenuation adrenal masses bilaterally. The mass on the right measures 2.5 x 2.2 cm. The mass on the left measures 1.9 x 1.7 cm. Visualized upper abdominal structures otherwise appear  unremarkable. 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 There are no blastic or lytic bone lesions. IMPRESSION: Patchy infiltrate left lower lobe. Question aspiration given the clinical history. Ground-glass opacity anterior segment right upper lobe measuring 1.2 x 0.7 cm. Semi-solid nodular opacity posterior segment right upper lobe measuring 1.0 x 0.6 cm. Non-contrast chest CT at 3-6 months is recommended. If nodules persist, subsequent management will be based upon the most suspicious nodule(s). This recommendation follows the consensus statement: Guidelines for Management of Incidental Pulmonary Nodules Detected on CT Images: From the Fleischner Society 2017; Radiology 2017; 284:228-243. Mildly prominent lymph nodes in the precarinal and sub- carinal regions. Question reactive etiology. Scarring with bronchiolectasis in both lung apices. There is bronchiectasis in the upper and lower lobe regions as well. Ascending thoracic aorta measures 4.4 x 4.4 cm. No dissection. Recommend annual imaging followup by CTA or MRA. This recommendation follows 2010 ACCF/AHA/AATS/ACR/ASA/SCA/SCAI/SIR/STS/SVM Guidelines for the Diagnosis and Management of Patients with Thoracic Aortic Disease. Circulation. 2010; [id number]:1689793. There are foci of calcification in several carotid arteries. Electronically Signed   By: Lowella Grip III M.D.   On:  01/28/2016 15:34     Assessment/Plan  HCAP: Started on van/zosyn Sputum cx sent  May send home on levofloxacin if improving  Suctioning Q1H Duoneb Q6H prn Oxygen therapy  Vomiting:  Could be related to pneumonia / infection vs. Outlet obstruction  NPO Gen surgery consult in am PPI , Zofran prn, morphine prn, switch meds to IV if possible  Hydralazine prn SBP>180   Input & Output: na Lines & Tubes: PIV DVT prophylaxis: [place] enoxaparin  GI prophylaxis: PPI Consultants: Gen surgery  Code Status: Full  Family  Communication: at bedside  Disposition Plan: Pam Drown M.D [facility]

## 2016-01-29 ENCOUNTER — Inpatient Hospital Stay (HOSPITAL_COMMUNITY): Payer: Medicare Other

## 2016-01-29 DIAGNOSIS — J189 Pneumonia, unspecified organism: Secondary | ICD-10-CM | POA: Diagnosis present

## 2016-01-29 DIAGNOSIS — R112 Nausea with vomiting, unspecified: Secondary | ICD-10-CM | POA: Diagnosis present

## 2016-01-29 DIAGNOSIS — K9423 Gastrostomy malfunction: Secondary | ICD-10-CM

## 2016-01-29 LAB — CBC
HCT: 29.7 % — ABNORMAL LOW (ref 39.0–52.0)
Hemoglobin: 9.7 g/dL — ABNORMAL LOW (ref 13.0–17.0)
MCH: 31 pg (ref 26.0–34.0)
MCHC: 32.7 g/dL (ref 30.0–36.0)
MCV: 94.9 fL (ref 78.0–100.0)
PLATELETS: 178 10*3/uL (ref 150–400)
RBC: 3.13 MIL/uL — AB (ref 4.22–5.81)
RDW: 12.4 % (ref 11.5–15.5)
WBC: 6.5 10*3/uL (ref 4.0–10.5)

## 2016-01-29 LAB — COMPREHENSIVE METABOLIC PANEL
ALK PHOS: 125 U/L (ref 38–126)
ALT: 26 U/L (ref 17–63)
AST: 25 U/L (ref 15–41)
Albumin: 3.5 g/dL (ref 3.5–5.0)
Anion gap: 7 (ref 5–15)
BILIRUBIN TOTAL: 0.5 mg/dL (ref 0.3–1.2)
BUN: 18 mg/dL (ref 6–20)
CALCIUM: 9.4 mg/dL (ref 8.9–10.3)
CHLORIDE: 100 mmol/L — AB (ref 101–111)
CO2: 32 mmol/L (ref 22–32)
CREATININE: 0.86 mg/dL (ref 0.61–1.24)
Glucose, Bld: 110 mg/dL — ABNORMAL HIGH (ref 65–99)
Potassium: 4.4 mmol/L (ref 3.5–5.1)
Sodium: 139 mmol/L (ref 135–145)
TOTAL PROTEIN: 7 g/dL (ref 6.5–8.1)

## 2016-01-29 MED ORDER — IPRATROPIUM-ALBUTEROL 0.5-2.5 (3) MG/3ML IN SOLN
3.0000 mL | Freq: Three times a day (TID) | RESPIRATORY_TRACT | Status: DC
  Administered 2016-01-30 – 2016-02-03 (×13): 3 mL via RESPIRATORY_TRACT
  Filled 2016-01-29 (×13): qty 3

## 2016-01-29 MED ORDER — JEVITY 1.5 CAL/FIBER PO LIQD
1000.0000 mL | ORAL | Status: DC
  Administered 2016-01-29: 1000 mL
  Filled 2016-01-29: qty 1000

## 2016-01-29 MED ORDER — FREE WATER
200.0000 mL | Freq: Four times a day (QID) | Status: DC
  Administered 2016-01-29 – 2016-02-02 (×14): 200 mL

## 2016-01-29 MED ORDER — JEVITY 1.5 CAL/FIBER PO LIQD
1000.0000 mL | ORAL | Status: DC
  Administered 2016-01-29 – 2016-01-31 (×3): 1000 mL
  Filled 2016-01-29 (×3): qty 1000

## 2016-01-29 NOTE — Care Management Note (Signed)
Case Management Note  Patient Details  Name: Cristian Harding MRN: [id number] Date of Birth: 1940/09/19  Subjective/Objective:      pna in pt with very complicated medical history q1 hour suctioning, tube feedings, o2,              Action/Plan: Date:  January 29, 2016 Chart reviewed for concurrent status and case management needs. Will continue to follow the patient for status change: Discharge Planning: following for needs Expected discharge date: [id number] Velva Harman, BSN, [place], [place]  Expected Discharge Date:   (unknown)               Expected Discharge Plan:  [place]  In-House Referral:     Discharge planning Services  CM Consult  Post Acute Care Choice:    Choice offered to:     DME Arranged:    DME Agency:     HH Arranged:    [place] Agency:     Status of Service:  In process, will continue to follow  If discussed at Long Length of Stay Meetings, dates discussed:    Additional Comments:  Leeroy Cha, RN 01/29/2016, 11:56 AM

## 2016-01-29 NOTE — Progress Notes (Signed)
Initial Nutrition Assessment  DOCUMENTATION CODES:   Not applicable  INTERVENTION:  - Jevity 1.5 only available in liter bottles (not cans) at this facility. During hospitalization, will order Jevity 1.5 @ 50 mL/hr with 200 mL free water QID to provide 1800 kcal, 77 grams of protein, and 1712 mL free water. - Please re-consult RD prior to d/c if needed.  Home regimen recommendation: 1 can Jevity 1.5 every 3 hours (5 cans/day). Flush with 100 mL free water before and 100 mL free water after each TF bolus.    NUTRITION DIAGNOSIS:   Swallowing difficulty related to dysphagia as evidenced by per patient/family report.  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 He has been doing more frequent suctioning recently and has been having worsening cough and hoarseness. No chest pain, fever or chills reported. He was treated recently for a pneumonia from (last month). In the ER, his sat was low to 70s and he had two episodes of vomiting and difficulty passing medications through the PEG tube. There is a suspicion that he has gastric outlet obstruction. In one episode , bloody fluid was aspirated but no coffee ground or clots. No abdominal pain or diarrhea. Last BM was yesterday. He gets nothing orally, all intake is via PEG tube and last meal was this morning.   Pt seen for consult for TF. BMI indicates normal weight. Pt with trach and PEG at baseline. Pt contributes to the conversation intermittently but wife, who is at bedside, provides information; daughter also at bedside and assists to conveys information provided by RD to pt's  wife.  He does not take anything PO and all nutrition and medications are provided via PEG, per wife's report. Pt has had PEG x8 years and has been on current TF regimen x7 years: 2 cans Jevity 1.5 BID and 1 can Jevity 1.5 once/day (5 cans/day total). This regimen provides 1775 kcal, 76 grams of protein, and 900 mL free water. Between flushes for medications + before and after boluses of TF, pt receives an additional 40 ounce (1183 mL) free water/day. Recently, pt has not been feeling well so he has only been taking 2 cans of Jevity 1.5 BID which provided 1420 kcal, 60 grams of protein, and 720 mL free water.   Wife reports that episode of high gastric residual and vomiting large amount of TF from trach has never occurred. She states that pt does have reflux and that he requires Reglan 30 minutes prior to all TF boluses. He intermittently has some TF-tinged secretions but these are very small in amount. PTA pt's PCP had recommended pt be provided with 1 can of Jevity 1.5 five times/day for 1 week to assess if he "felt better." Wife and pt report that pt did not notice an overt difference with this. Explained that although a difference may not be felt, decreasing TF bolus volume may be beneficial given current dx and the need for resolution of possible (aspiration) PNA.   Discussed continuing 1 can of Jevity 1.5 five times daily at home home until medical personnel feel  that PNA has fully resolved. Pt and wife open to this, but reluctant. Wife repeats above noted information multiple times and does not appear to fully understand that change to TF regimen would likely be temporary until acute illness has resolved. She re-iterates that this occurrence has never happened in the 7 years pt has been on current TF regimen.  Wife reports that pt's weight has been stable over the past few years. No previous weight hx available in the chart. Unable to perform physical assessment d/t conversation and pt being taken by  transport at the end of RD discussion with pt and family.   Medications reviewed; 50 mcg IV Synthroid daily, 10 mg Reglan TID, PRN IV Zofran, 40 mg Protonix daily, PRN Miralax. Labs reviewed; Cl: 100 mmol/L.    Diet Order:  Diet NPO time specified  Skin:  Reviewed, no issues  Last BM:  PTA/unknown  Height:   Ht Readings from Last 1 Encounters:  01/28/16 5\' 9"  (1.753 m)    Weight:   Wt Readings from Last 1 Encounters:  01/28/16 158 lb (71.7 kg)    Ideal Body Weight:  72.73 kg  BMI:  Body mass index is 23.33 kg/m.  Estimated Nutritional Needs:   Kcal:  [id number] (22-26 kcal/kg)  Protein:  65-75 grams  Fluid:  >/= 2 L/day  EDUCATION NEEDS:   No education needs identified at this time    Jarome Matin, MS, RD, LDN Inpatient Clinical Dietitian Pager # [phone] After hours/weekend pager # [phone]

## 2016-01-29 NOTE — Progress Notes (Signed)
Asked to see patient to assure g-tube is working well.  Upon arrival to the room, the patient's g-tube is connected to TFs and is running well.  The x-rays from this morning appear to show his g-tube in good position with no evidence of obstruction.  No intervention warranted for this g-tube at this time.  Maleaha Hughett E 01/29/2016

## 2016-01-29 NOTE — Progress Notes (Addendum)
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 He has been doing more frequent suctioning recently and has been having worsening cough and hoarseness. No chest pain, fever or chills reported. He was treated recently for a pneumonia from (last month). In the ER, his sat was low to 70s and he had two episodes of vomiting and difficulty passing medications through the PEG tube. There is a suspicion that he has gastric outlet obstruction. In one episode , bloody fluid was aspirated but no coffee ground or clots. No abdominal pain or diarrhea. Last BM was yesterday. He gets nothing orally, all intake is via PEG tube and last meal was this morning    Subjective   Patient seen and examined, breathing has improved. Denies any vomiting overnight.   Assessment/Plan:     1. Healthcare associated pneumonia- patchy infiltrate in the left lower lobe, question aspiration as per CT chest. Continue vancomycin and Zosyn. Follow sputum culture, suction when necessary, duo nebs every 6 hours. 2. Vomiting- resolved, patient had large gastric residual, consult IR for PEG tube malfunction. Also check abdominal x-ray to rule out underlying ileus versus SBO. 3. Nutrition- patient's wife says  that she has been giving 2 cans of Jevity twice a day, will consult nutrition for further recommendations regarding giving Jevity at home. 4. Hypertension hydralazine when necessary 5. Pulmonary nodule- CT chest shows pulmonary noduleSemi-solid nodular opacity posterior segment right upper           lobe measuring 1.0 x 0.6 cm. noncontrast CT in 3-6 months is recommended to check pulmonary nodules. 6.        Aortic aneurysm- Ascending thoracic aorta  measures 4.4 x 4.4 cm. No dissection, and will CTA or MRA recommended for screening.     DVT prophylaxis: Lovenox  Code Status: Full code  Family Communication: Discussed with patient's daughter and wife at bedside   Disposition Plan: home in next 24- 48 hrs   Consultants:  None   Procedures:  None   Antibiotics:   Anti-infectives    Start     Dose/Rate Route Frequency Ordered Stop   01/29/16 0800  vancomycin (VANCOCIN) IVPB 1000 mg/200 mL premix     1,000 mg 200 mL/hr over 60 Minutes Intravenous Every 12 hours 01/28/16 1949     01/28/16 2000  piperacillin-tazobactam (ZOSYN) IVPB 3.375 g     3.375 g 12.5 mL/hr over 240 Minutes Intravenous Every 8 hours 01/28/16 1949     01/28/16 2000  vancomycin (VANCOCIN) 1,500 mg in sodium chloride 0.9 % 500 mL IVPB     1,500 mg 250 mL/hr over 120 Minutes Intravenous  Once 01/28/16 1949 01/28/16 2224       Objective   Vitals:   01/29/16 0843 01/29/16 0900 01/29/16 1000 01/29/16 1100  BP:  (!) 158/73 (!) 142/62 109/76  Pulse:  71 67 66  Resp:  17 (!) 24 15  Temp:      TempSrc:      SpO2: 96% 98% 100% 98%  Weight:      Height:        Intake/Output Summary (Last 24 hours) at 01/29/16 1120 Last data filed at 01/29/16 1100  Gross per 24  hour  Intake             1250 ml  Output             1175 ml  Net               75 ml   Filed Weights   01/28/16 1046  Weight: 71.7 kg (158 lb)     Physical Examination:  General exam: Appears calm and comfortable. Respiratory system: Trach collar in place, Clear to auscultation. Respiratory effort normal. Cardiovascular system:  RRR. No  murmurs, rubs, gallops. No pedal edema. GI system: Abdomen is nondistended, soft and nontender. No organomegaly. Peg tube in place. Central nervous system. No focal neurological deficits. 5 x 5 power in all extremities. Skin: No rashes, lesions or ulcers. Psychiatry: Alert, oriented x 3.Judgement and insight appear normal. Affect  normal.    Data Reviewed: I have personally reviewed following labs and imaging studies  CBG: No results for input(s): GLUCAP in the last 168 hours.  CBC:  Recent Labs Lab 01/28/16 1215 01/29/16 0310  WBC 3.3* 6.5  NEUTROABS 2.3  --   HGB 9.7* 9.7*  HCT 30.0* 29.7*  MCV 97.4 94.9  PLT 153 0000000    Basic Metabolic Panel:  Recent Labs Lab 01/28/16 1215 01/29/16 0310  NA 138 139  K 4.1 4.4  CL 98* 100*  CO2 35* 32  GLUCOSE 140* 110*  BUN 21* 18  CREATININE 0.89 0.86  CALCIUM 9.5 9.4    Recent Results (from the past 240 hour(s))  MRSA PCR Screening     Status: Abnormal   Collection Time: 01/28/16  8:45 PM  Result Value Ref Range Status   MRSA by PCR POSITIVE (A) NEGATIVE Final    Comment:        The GeneXpert MRSA Assay (FDA approved for NASAL specimens only), is one component of a comprehensive MRSA colonization surveillance program. It is not intended to diagnose MRSA infection nor to guide or monitor treatment for MRSA infections. RESULT CALLED TO, READ BACK BY AND VERIFIED WITH: BEN MADDOX,RN [id number] @ 2237 BY J SCOTTON      Liver Function Tests:  Recent Labs Lab 01/29/16 0310  AST 25  ALT 26  ALKPHOS 125  BILITOT 0.5  PROT 7.0  ALBUMIN 3.5    Cardiac Enzymes:  Recent Labs Lab 01/28/16 1215  TROPONINI <0.03   BNP (last 3 results) No results for input(s): BNP in the last 8760 hours.  ProBNP (last 3 results) No results for input(s): PROBNP in the last 8760 hours.    Studies: Dg Chest 2 View  Result Date: 01/28/2016 CLINICAL DATA:  Pneumonia 2 weeks ago EXAM: CHEST  2 VIEW COMPARISON:  01/17/2016 FINDINGS: Patchy airspace disease in the right upper lobe has nearly resolved. There is minimal ill-defined opacities remaining. Lungs otherwise clear. Low volumes. Normal heart size. No pneumothorax or pleural effusion. IMPRESSION: Nearly resolved right upper lobe pneumonia. This supports benign etiology. Electronically Signed   By: Marybelle Killings M.D.   On: 01/28/2016 11:57   Ct Chest W Contrast  Result Date: 01/28/2016 CLINICAL DATA:  Reflux with concern for aspiration. Prior tracheostomy. EXAM: CT CHEST WITH CONTRAST TECHNIQUE: Multidetector CT imaging of the chest was performed during intravenous contrast administration. CONTRAST:  39mL ISOVUE-300 IOPAMIDOL (ISOVUE-300) INJECTION 61% COMPARISON:  Chest radiograph January 28, 2016 FINDINGS: Cardiovascular: There is prominence of the ascending thoracic aorta with a measured transverse diameter of 4.4 x 4.4 cm. No thoracic  aortic dissection is seen. There are scattered foci of calcification in the aorta. There is mild calcification in the proximal great vessels as well. There are foci of coronary artery calcification evident. The pericardium is not appreciably thickened. No major vessel pulmonary embolus is demonstrated. Mediastinum/Nodes: Tracheostomy catheter is positioned with the tip in the mid trachea. Thyroid is diminutive without mass evident. There are scattered subcentimeter mediastinal lymph nodes. There is a focal lymph node anterior to the carina on the right measuring 1.4 x 1.2 cm. There is a sub- carinal lymph node measuring 2.0 x 1.7 cm. No other lymph node prominence is evident in the thoracic region. Lungs/Pleura: There is an azygos lobe on the right, an anatomic variant. There is scarring in the apices with localized bronchiolectasis in the apices medially. There is a semi-solid nodular opacity in the posterior segment of the right upper lobe measuring 1.0 x 0.6 cm, seen on axial slice 52 series 4. There is patchy ground-glass type opacity in the anterior segment of the right upper lobe, measuring 1.2 x 0.7 cm, best appreciated on axial slice 30 series 4. There is mild patchy infiltrate in the superior and medial segments of the left lower lobe. There is mild upper and lower lobe bronchiectatic change bilaterally. Upper Abdomen: In the visualized upper abdomen, there is a  gastrostomy catheter extending into the stomach, incompletely visualized. There are low-attenuation adrenal masses bilaterally. The mass on the right measures 2.5 x 2.2 cm. The mass on the left measures 1.9 x 1.7 cm. Visualized upper abdominal structures otherwise appear unremarkable. 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 There are no blastic or lytic bone lesions. IMPRESSION: Patchy infiltrate left lower lobe. Question aspiration given the clinical history. Ground-glass opacity anterior segment right upper lobe measuring 1.2 x 0.7 cm. Semi-solid nodular opacity posterior segment right upper lobe measuring 1.0 x 0.6 cm. Non-contrast chest CT at 3-6 months is recommended. If nodules persist, subsequent management will be based upon the most suspicious nodule(s). This recommendation follows the consensus statement: Guidelines for Management of Incidental Pulmonary Nodules Detected on CT Images: From the Fleischner Society 2017; Radiology 2017; 284:228-243. Mildly prominent lymph nodes in the precarinal and sub- carinal regions. Question reactive etiology. Scarring with bronchiolectasis in both lung apices. There is bronchiectasis in the upper and lower lobe regions as well. Ascending thoracic aorta measures 4.4 x 4.4 cm. No dissection. Recommend annual imaging followup by CTA or MRA. This recommendation follows 2010 ACCF/AHA/AATS/ACR/ASA/SCA/SCAI/SIR/STS/SVM Guidelines for the Diagnosis and Management of Patients with Thoracic Aortic Disease. Circulation. 2010; 121ZK:5694362. There are foci of calcification in several carotid arteries. Electronically Signed   By: Lowella Grip III M.D.   On: 01/28/2016 15:34    Scheduled Meds: . enoxaparin (LOVENOX) injection  40 mg Subcutaneous Q24H  . fentaNYL  75 mcg Transdermal Q72H  . free water  200 mL Per Tube QID  . glycopyrrolate  1 mg Oral BID  . ipratropium-albuterol  3 mL Nebulization Q6H  . levothyroxine   50 mcg Intravenous QAC breakfast  . metoCLOPramide  10 mg Oral TID AC  . pantoprazole sodium  40 mg Per Tube Daily  . piperacillin-tazobactam (ZOSYN)  IV  3.375 g Intravenous Q8H  . traZODone  100 mg Per J Tube QHS  . vancomycin  1,000 mg Intravenous Q12H    Continuous Infusions: . feeding supplement (JEVITY 1.5 CAL/FIBER) 1,000 mL (01/29/16 0830)    Time spent: 25 min  Zarah Carbon S  [facility] Pager [phone]. If 7PM-7AM, please contact night-coverage at [email or web address], Office  [phone]  password [email or web address] 01/29/2016, 11:20 AM  LOS: 1 day

## 2016-01-30 MED ORDER — CHLORHEXIDINE GLUCONATE CLOTH 2 % EX PADS
6.0000 | MEDICATED_PAD | Freq: Every day | CUTANEOUS | Status: DC
  Administered 2016-01-30: 6 via TOPICAL

## 2016-01-30 MED ORDER — FENTANYL 75 MCG/HR TD PT72
75.0000 ug | MEDICATED_PATCH | TRANSDERMAL | Status: DC
  Administered 2016-01-30 – 2016-02-02 (×2): 75 ug via TRANSDERMAL
  Filled 2016-01-30 (×2): qty 1

## 2016-01-30 MED ORDER — PROMETHAZINE HCL 25 MG/ML IJ SOLN
12.5000 mg | Freq: Once | INTRAMUSCULAR | Status: AC
  Administered 2016-01-30: 12.5 mg via INTRAVENOUS
  Filled 2016-01-30: qty 1

## 2016-01-30 MED ORDER — MUPIROCIN 2 % EX OINT
1.0000 "application " | TOPICAL_OINTMENT | Freq: Two times a day (BID) | CUTANEOUS | Status: AC
  Administered 2016-01-30 – 2016-02-03 (×10): 1 via NASAL
  Filled 2016-01-30 (×2): qty 22

## 2016-01-30 MED ORDER — ACETAMINOPHEN 325 MG PO TABS
650.0000 mg | ORAL_TABLET | Freq: Once | ORAL | Status: AC
  Administered 2016-01-30: 650 mg via ORAL
  Filled 2016-01-30: qty 2

## 2016-01-30 NOTE — Progress Notes (Signed)
One time dose 12.5mg  Phenergan IVP given per MD order.    2100:   Pt reporting less nausea.

## 2016-01-30 NOTE — Progress Notes (Signed)
Pt reports increased nausea despite Zofran given at 1830.  Also reporting headache and just feeling "off all over."  Paged hospitalist to try phenergan and tylenol.

## 2016-01-30 NOTE — Progress Notes (Signed)
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 He has been doing more frequent suctioning recently and has been having worsening cough and hoarseness. No chest pain, fever or chills reported. He was treated recently for a pneumonia from (last month). In the ER, his sat was low to 70s and he had two episodes of vomiting and difficulty passing medications through the PEG tube. There is a suspicion that he has gastric outlet obstruction. In one episode , bloody fluid was aspirated but no coffee ground or clots. No abdominal pain or diarrhea. Last BM was yesterday. He gets nothing orally, all intake is via PEG tube and last meal was this morning    Subjective   Patient seen and examined, breathing has improved. Denies any vomiting overnight. Extra-abdominal shows nonobstructive bowel gas pattern, PEG tube functioning fine as per IR.   Assessment/Plan:     1. Healthcare associated pneumonia- patchy infiltrate in the left lower lobe, question aspiration as per CT chest. Continue vancomycin and Zosyn. Follow sputum culture, suction when necessary, duo nebs every 6 hours. 2. Vomiting- resolved, patient had large gastric residual, consulted IR for PEG tube malfunction, PEG tube functioning fine. Also checked abdominal x-ray which showed nonobstructive bowel gas pattern. 3. Nutrition- patient's wife says  that she has been giving 2 cans of Jevity twice a day, nutrition was consulted and Jevity rate increased to 50 mL per hour in the hospital.Home regimen recommendation: 1 can Jevity 1.5 every 3 hours (5 cans/day). Flush with 100 mL free water before and 100 mL free water after each TF bolus.  4. Hypertension hydralazine when  necessary 5. Pulmonary nodule- CT chest shows pulmonary noduleSemi-solid nodular opacity posterior segment right upper           lobe measuring 1.0 x 0.6 cm. noncontrast CT in 3-6 months is recommended to check pulmonary nodules. 6.        Aortic aneurysm- Ascending thoracic aorta measures 4.4 x 4.4 cm. No dissection, and will CTA or MRA recommended for screening.     DVT prophylaxis: Lovenox  Code Status: Full code  Family Communication: Discussed with patient's daughter and wife at bedside  on 01/29/2016  Disposition Plan: home in next 24- 48 hrs   Consultants:  None   Procedures:  None   Antibiotics:   Anti-infectives    Start     Dose/Rate Route Frequency Ordered Stop   01/29/16 0800  vancomycin (VANCOCIN) IVPB 1000 mg/200 mL premix     1,000 mg 200 mL/hr over 60 Minutes Intravenous Every 12 hours 01/28/16 1949     01/28/16 2000  piperacillin-tazobactam (ZOSYN) IVPB 3.375 g     3.375 g 12.5 mL/hr over 240 Minutes Intravenous Every 8 hours 01/28/16 1949     01/28/16 2000  vancomycin (VANCOCIN) 1,500 mg in sodium chloride 0.9 % 500 mL IVPB     1,500 mg 250 mL/hr over 120 Minutes Intravenous  Once 01/28/16 1949 01/28/16 2224       Objective   Vitals:   01/30/16 0500 01/30/16 0700 01/30/16 0800 01/30/16 0806  BP: (!) 155/66  (!) 167/84   Pulse: 78 67 73   Resp: 18 16 20    Temp:  97.9 F (36.6 C)   TempSrc:   Oral   SpO2: 95% 99% 97% 99%  Weight:      Height:        Intake/Output Summary (Last 24 hours) at 01/30/16 0917 Last data filed at 01/30/16 0800  Gross per 24 hour  Intake             1150 ml  Output             1150 ml  Net                0 ml   Filed Weights   01/28/16 1046  Weight: 71.7 kg (158 lb)     Physical Examination:  General exam: Appears calm and comfortable. Respiratory system: Trach collar in place, Clear to auscultation. Respiratory effort normal. Cardiovascular system:  RRR. No  murmurs, rubs, gallops. No pedal edema. GI  system: Abdomen is nondistended, soft and nontender. No organomegaly. Peg tube in place. Central nervous system. No focal neurological deficits. 5 x 5 power in all extremities. Skin: No rashes, lesions or ulcers. Psychiatry: Alert, oriented x 3.Judgement and insight appear normal. Affect normal.    Data Reviewed: I have personally reviewed following labs and imaging studies  CBG: No results for input(s): GLUCAP in the last 168 hours.  CBC:  Recent Labs Lab 01/28/16 1215 01/29/16 0310  WBC 3.3* 6.5  NEUTROABS 2.3  --   HGB 9.7* 9.7*  HCT 30.0* 29.7*  MCV 97.4 94.9  PLT 153 0000000    Basic Metabolic Panel:  Recent Labs Lab 01/28/16 1215 01/29/16 0310  NA 138 139  K 4.1 4.4  CL 98* 100*  CO2 35* 32  GLUCOSE 140* 110*  BUN 21* 18  CREATININE 0.89 0.86  CALCIUM 9.5 9.4    Recent Results (from the past 240 hour(s))  MRSA PCR Screening     Status: Abnormal   Collection Time: 01/28/16  8:45 PM  Result Value Ref Range Status   MRSA by PCR POSITIVE (A) NEGATIVE Final    Comment:        The GeneXpert MRSA Assay (FDA approved for NASAL specimens only), is one component of a comprehensive MRSA colonization surveillance program. It is not intended to diagnose MRSA infection nor to guide or monitor treatment for MRSA infections. RESULT CALLED TO, READ BACK BY AND VERIFIED WITH: BEN MADDOX,RN [id number] @ 2237 BY J SCOTTON      Liver Function Tests:  Recent Labs Lab 01/29/16 0310  AST 25  ALT 26  ALKPHOS 125  BILITOT 0.5  PROT 7.0  ALBUMIN 3.5    Cardiac Enzymes:  Recent Labs Lab 01/28/16 1215  TROPONINI <0.03   BNP (last 3 results) No results for input(s): BNP in the last 8760 hours.  ProBNP (last 3 results) No results for input(s): PROBNP in the last 8760 hours.    Studies: Dg Chest 2 View  Result Date: 01/28/2016 CLINICAL DATA:  Pneumonia 2 weeks ago EXAM: CHEST  2 VIEW COMPARISON:  01/17/2016 FINDINGS: Patchy airspace disease in the right  upper lobe has nearly resolved. There is minimal ill-defined opacities remaining. Lungs otherwise clear. Low volumes. Normal heart size. No pneumothorax or pleural effusion. IMPRESSION: Nearly resolved right upper lobe pneumonia. This supports benign etiology. Electronically Signed   By: Marybelle Killings M.D.   On: 01/28/2016 11:57   Ct Chest W Contrast  Result Date: 01/28/2016 CLINICAL DATA:  Reflux with concern for aspiration. Prior tracheostomy. EXAM: CT CHEST WITH  CONTRAST TECHNIQUE: Multidetector CT imaging of the chest was performed during intravenous contrast administration. CONTRAST:  72mL ISOVUE-300 IOPAMIDOL (ISOVUE-300) INJECTION 61% COMPARISON:  Chest radiograph January 28, 2016 FINDINGS: Cardiovascular: There is prominence of the ascending thoracic aorta with a measured transverse diameter of 4.4 x 4.4 cm. No thoracic aortic dissection is seen. There are scattered foci of calcification in the aorta. There is mild calcification in the proximal great vessels as well. There are foci of coronary artery calcification evident. The pericardium is not appreciably thickened. No major vessel pulmonary embolus is demonstrated. Mediastinum/Nodes: Tracheostomy catheter is positioned with the tip in the mid trachea. Thyroid is diminutive without mass evident. There are scattered subcentimeter mediastinal lymph nodes. There is a focal lymph node anterior to the carina on the right measuring 1.4 x 1.2 cm. There is a sub- carinal lymph node measuring 2.0 x 1.7 cm. No other lymph node prominence is evident in the thoracic region. Lungs/Pleura: There is an azygos lobe on the right, an anatomic variant. There is scarring in the apices with localized bronchiolectasis in the apices medially. There is a semi-solid nodular opacity in the posterior segment of the right upper lobe measuring 1.0 x 0.6 cm, seen on axial slice 52 series 4. There is patchy ground-glass type opacity in the anterior segment of the right upper lobe,  measuring 1.2 x 0.7 cm, best appreciated on axial slice 30 series 4. There is mild patchy infiltrate in the superior and medial segments of the left lower lobe. There is mild upper and lower lobe bronchiectatic change bilaterally. Upper Abdomen: In the visualized upper abdomen, there is a gastrostomy catheter extending into the stomach, incompletely visualized. There are low-attenuation adrenal masses bilaterally. The mass on the right measures 2.5 x 2.2 cm. The mass on the left measures 1.9 x 1.7 cm. Visualized upper abdominal structures otherwise appear unremarkable. 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 There are no blastic or lytic bone lesions. IMPRESSION: Patchy infiltrate left lower lobe. Question aspiration given the clinical history. Ground-glass opacity anterior segment right upper lobe measuring 1.2 x 0.7 cm. Semi-solid nodular opacity posterior segment right upper lobe measuring 1.0 x 0.6 cm. Non-contrast chest CT at 3-6 months is recommended. If nodules persist, subsequent management will be based upon the most suspicious nodule(s). This recommendation follows the consensus statement: Guidelines for Management of Incidental Pulmonary Nodules Detected on CT Images: From the Fleischner Society 2017; Radiology 2017; 284:228-243. Mildly prominent lymph nodes in the precarinal and sub- carinal regions. Question reactive etiology. Scarring with bronchiolectasis in both lung apices. There is bronchiectasis in the upper and lower lobe regions as well. Ascending thoracic aorta measures 4.4 x 4.4 cm. No dissection. Recommend annual imaging followup by CTA or MRA. This recommendation follows 2010 ACCF/AHA/AATS/ACR/ASA/SCA/SCAI/SIR/STS/SVM Guidelines for the Diagnosis and Management of Patients with Thoracic Aortic Disease. Circulation. 2010; 121ZK:5694362. There are foci of calcification in several carotid arteries. Electronically Signed   By: Lowella Grip  III M.D.   On: 01/28/2016 15:34   Dg Abd 2 Views  Result Date: 01/29/2016 CLINICAL DATA:  Status post PEG tube placement. Dyspnea. Symptoms for the last few days. Requiring more frequent suctioning. Cough and hoarseness. Recent pneumonia. Question of gastric outlet obstruction. EXAM: ABDOMEN - 2 VIEW COMPARISON:  None. FINDINGS: No free intraperitoneal air on decubitus view of the abdomen. Gastrostomy tube overlies the left upper quadrant. Bowel gas pattern is nonobstructive. Contrast is identified within the urinary bladder following intravenous contrast given  for CT of the chest yesterday. There is a wedge compression fracture of L1. IMPRESSION: Nonobstructed bowel-gas pattern. Electronically Signed   By: Nolon Nations M.D.   On: 01/29/2016 14:25    Scheduled Meds: . Chlorhexidine Gluconate Cloth  6 each Topical Q0600  . enoxaparin (LOVENOX) injection  40 mg Subcutaneous Q24H  . fentaNYL  75 mcg Transdermal Q72H  . free water  200 mL Per Tube QID  . glycopyrrolate  1 mg Oral BID  . ipratropium-albuterol  3 mL Nebulization TID  . levothyroxine  50 mcg Intravenous QAC breakfast  . metoCLOPramide  10 mg Oral TID AC  . mupirocin ointment  1 application Nasal BID  . pantoprazole sodium  40 mg Per Tube Daily  . piperacillin-tazobactam (ZOSYN)  IV  3.375 g Intravenous Q8H  . traZODone  100 mg Per J Tube QHS  . vancomycin  1,000 mg Intravenous Q12H    Continuous Infusions: . feeding supplement (JEVITY 1.5 CAL/FIBER) 1,000 mL (01/29/16 1400)    Time spent: 25 min  [facility] Pager [phone]. If 7PM-7AM, please contact night-coverage at [email or web address], Office  [phone]  password [email or web address] 01/30/2016, 9:17 AM  LOS: 2 days

## 2016-01-31 MED ORDER — METOPROLOL TARTRATE 25 MG PO TABS
25.0000 mg | ORAL_TABLET | Freq: Two times a day (BID) | ORAL | Status: DC
  Administered 2016-01-31 – 2016-02-04 (×9): 25 mg via ORAL
  Filled 2016-01-31 (×9): qty 1

## 2016-01-31 MED ORDER — HYDRALAZINE HCL 25 MG PO TABS
25.0000 mg | ORAL_TABLET | Freq: Four times a day (QID) | ORAL | Status: DC | PRN
  Administered 2016-02-01: 25 mg
  Filled 2016-01-31: qty 1

## 2016-01-31 MED ORDER — JEVITY 1.5 CAL/FIBER PO LIQD
1000.0000 mL | ORAL | Status: DC
  Administered 2016-02-01: 1000 mL
  Filled 2016-01-31 (×2): qty 1000

## 2016-01-31 MED ORDER — ACETAMINOPHEN 325 MG PO TABS
650.0000 mg | ORAL_TABLET | Freq: Four times a day (QID) | ORAL | Status: DC | PRN
  Administered 2016-01-31: 650 mg
  Filled 2016-01-31: qty 2

## 2016-01-31 MED ORDER — PROMETHAZINE HCL 25 MG/ML IJ SOLN
12.5000 mg | Freq: Once | INTRAMUSCULAR | Status: AC
  Administered 2016-01-31: 12.5 mg via INTRAVENOUS
  Filled 2016-01-31: qty 1

## 2016-01-31 MED ORDER — GUAIFENESIN 100 MG/5ML PO SOLN
20.0000 mL | ORAL | Status: DC
  Administered 2016-01-31 – 2016-02-04 (×25): 400 mg via ORAL
  Filled 2016-01-31: qty 10
  Filled 2016-01-31 (×15): qty 20
  Filled 2016-01-31 (×2): qty 10
  Filled 2016-01-31 (×4): qty 20
  Filled 2016-01-31: qty 10
  Filled 2016-01-31 (×4): qty 20

## 2016-01-31 MED ORDER — GUAIFENESIN ER 600 MG PO TB12: 1200.0000 mg | ORAL_TABLET | Freq: Two times a day (BID) | ORAL | Status: DC

## 2016-01-31 MED ORDER — LEVOFLOXACIN IN D5W 750 MG/150ML IV SOLN
750.0000 mg | INTRAVENOUS | Status: DC
  Administered 2016-01-31: 750 mg via INTRAVENOUS
  Filled 2016-01-31: qty 150

## 2016-01-31 MED ORDER — GUAIFENESIN 100 MG/5ML PO SOLN: 5.0000 mL | ORAL | Status: DC | PRN

## 2016-01-31 NOTE — Progress Notes (Signed)
Pt requested to try condom catheter again.  RN explained that pt still needs to produce urine to not be I&O cath'd and that we would bladder scan prior to that happening.  Pt was able to answer questions and verbalized understanding.  Condom catheter placed.

## 2016-01-31 NOTE — Progress Notes (Signed)
RT placed PT on new ATC setup at 6 lpm for 28%. Sp02 97%, HR 72, RR 14, BP 133/81, BBS cl-dim (RN just suctioned PT). RN aware.

## 2016-01-31 NOTE — Progress Notes (Signed)
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 He has been doing more frequent suctioning recently and has been having worsening cough and hoarseness. No chest pain, fever or chills reported. He was treated recently for a pneumonia from (last month). In the ER, his sat was low to 70s and he had two episodes of vomiting and difficulty passing medications through the PEG tube. There is a suspicion that he has gastric outlet obstruction. In one episode , bloody fluid was aspirated but no coffee ground or clots. No abdominal pain or diarrhea. Last BM was yesterday. He gets nothing orally, all intake is via PEG tube and last meal was this morning.    Subjective   Patient seen and examined,No vomiting. Tolerating tube feeds well. Still has a lot of lung congestion.   Assessment/Plan:     1. Healthcare associated pneumonia- patchy infiltrate in the left lower lobe, question aspiration as per CT chest. Patient was started on  vancomycin and Zosyn. Will switch to IV Levaquin. Robitussin 400 mg every 4 hours. Suction when necessary 2. Vomiting- resolved, patient had large gastric residual, consulted IR for PEG tube malfunction, PEG tube functioning fine. Also checked abdominal x-ray which showed nonobstructive bowel gas pattern. 3. Nutrition- patient's wife says  that she has been giving 2 cans of Jevity twice a day, nutrition was consulted and Jevity rate increased to 50 mL per hour in the hospital.Home regimen recommendation: 1 can Jevity 1.5 every 3 hours (5 cans/day). Flush with 100 mL free water before and 100 mL free water after each TF bolus.  4. Hypertension hydralazine when necessary 5. Pulmonary nodule- CT chest shows  pulmonary noduleSemi-solid nodular opacity posterior segment right upper           lobe measuring 1.0 x 0.6 cm. noncontrast CT in 3-6 months is recommended to check pulmonary nodules. 6.        Aortic aneurysm- Ascending thoracic aorta measures 4.4 x 4.4 cm. No dissection, and will CTA or MRA recommended for screening.     DVT prophylaxis: Lovenox  Code Status: Full code  Family Communication: Discussed with patient's daughter and wife at bedside  on 01/29/2016  Disposition Plan: home in next 24- 48 hrs   Consultants:  None   Procedures:  None   Antibiotics:   Anti-infectives    Start     Dose/Rate Route Frequency Ordered Stop   01/31/16 1000  levofloxacin (LEVAQUIN) IVPB 750 mg     750 mg 100 mL/hr over 90 Minutes Intravenous Every 24 hours 01/31/16 0830     01/29/16 0800  vancomycin (VANCOCIN) IVPB 1000 mg/200 mL premix  Status:  Discontinued     1,000 mg 200 mL/hr over 60 Minutes Intravenous Every 12 hours 01/28/16 1949 01/31/16 0752   01/28/16 2000  piperacillin-tazobactam (ZOSYN) IVPB 3.375 g  Status:  Discontinued     3.375 g 12.5 mL/hr over 240 Minutes Intravenous Every 8 hours 01/28/16 1949 01/31/16 0752   01/28/16 2000  vancomycin (VANCOCIN) 1,500 mg in sodium chloride 0.9 % 500 mL IVPB     1,500 mg 250 mL/hr over 120 Minutes Intravenous  Once 01/28/16 1949 01/28/16 2224  Objective   Vitals:   01/31/16 0700 01/31/16 0800 01/31/16 0821 01/31/16 1200  BP: (!) 160/73 133/81    Pulse: 72 79  85  Resp: 14 14  19   Temp:  98.5 F (36.9 C)  98.2 F (36.8 C)  TempSrc:  Oral  Oral  SpO2: 97% 90% 97% 94%  Weight:      Height:        Intake/Output Summary (Last 24 hours) at 01/31/16 1248 Last data filed at 01/31/16 1056  Gross per 24 hour  Intake           1847.5 ml  Output             1700 ml  Net            147.5 ml   Filed Weights   01/28/16 1046  Weight: 71.7 kg (158 lb)     Physical Examination:  General exam: Appears calm and  comfortable. Respiratory system: Trach collar in place, Clear to auscultation. Respiratory effort normal. Cardiovascular system:  RRR. No  murmurs, rubs, gallops. No pedal edema. GI system: Abdomen is nondistended, soft and nontender. No organomegaly. Peg tube in place. Central nervous system. No focal neurological deficits. 5 x 5 power in all extremities. Skin: No rashes, lesions or ulcers. Psychiatry: Alert, oriented x 3.Judgement and insight appear normal. Affect normal.    Data Reviewed: I have personally reviewed following labs and imaging studies  CBG: No results for input(s): GLUCAP in the last 168 hours.  CBC:  Recent Labs Lab 01/28/16 1215 01/29/16 0310  WBC 3.3* 6.5  NEUTROABS 2.3  --   HGB 9.7* 9.7*  HCT 30.0* 29.7*  MCV 97.4 94.9  PLT 153 0000000    Basic Metabolic Panel:  Recent Labs Lab 01/28/16 1215 01/29/16 0310  NA 138 139  K 4.1 4.4  CL 98* 100*  CO2 35* 32  GLUCOSE 140* 110*  BUN 21* 18  CREATININE 0.89 0.86  CALCIUM 9.5 9.4    Recent Results (from the past 240 hour(s))  MRSA PCR Screening     Status: Abnormal   Collection Time: 01/28/16  8:45 PM  Result Value Ref Range Status   MRSA by PCR POSITIVE (A) NEGATIVE Final    Comment:        The GeneXpert MRSA Assay (FDA approved for NASAL specimens only), is one component of a comprehensive MRSA colonization surveillance program. It is not intended to diagnose MRSA infection nor to guide or monitor treatment for MRSA infections. RESULT CALLED TO, READ BACK BY AND VERIFIED WITH: BEN MADDOX,RN [id number] @ 2237 BY J SCOTTON      Liver Function Tests:  Recent Labs Lab 01/29/16 0310  AST 25  ALT 26  ALKPHOS 125  BILITOT 0.5  PROT 7.0  ALBUMIN 3.5    Cardiac Enzymes:  Recent Labs Lab 01/28/16 1215  TROPONINI <0.03   BNP (last 3 results) No results for input(s): BNP in the last 8760 hours.  ProBNP (last 3 results) No results for input(s): PROBNP in the last 8760  hours.    Studies: Dg Abd 2 Views  Result Date: 01/29/2016 CLINICAL DATA:  Status post PEG tube placement. Dyspnea. Symptoms for the last few days. Requiring more frequent suctioning. Cough and hoarseness. Recent pneumonia. Question of gastric outlet obstruction. EXAM: ABDOMEN - 2 VIEW COMPARISON:  None. FINDINGS: No free intraperitoneal air on decubitus view of the abdomen. Gastrostomy tube overlies the left upper quadrant. Bowel gas pattern is nonobstructive. Contrast  is identified within the urinary bladder following intravenous contrast given for CT of the chest yesterday. There is a wedge compression fracture of L1. IMPRESSION: Nonobstructed bowel-gas pattern. Electronically Signed   By: Nolon Nations M.D.   On: 01/29/2016 14:25    Scheduled Meds: . Chlorhexidine Gluconate Cloth  6 each Topical Q0600  . enoxaparin (LOVENOX) injection  40 mg Subcutaneous Q24H  . fentaNYL  75 mcg Transdermal Q72H  . free water  200 mL Per Tube QID  . glycopyrrolate  1 mg Oral BID  . guaiFENesin  20 mL Oral Q4H  . ipratropium-albuterol  3 mL Nebulization TID  . levofloxacin (LEVAQUIN) IV  750 mg Intravenous Q24H  . levothyroxine  50 mcg Intravenous QAC breakfast  . metoCLOPramide  10 mg Oral TID AC  . mupirocin ointment  1 application Nasal BID  . pantoprazole sodium  40 mg Per Tube Daily  . traZODone  100 mg Per J Tube QHS    Continuous Infusions: . feeding supplement (JEVITY 1.5 CAL/FIBER) 1,000 mL (01/30/16 2200)    Time spent: 25 min  [facility] Pager [phone]. If 7PM-7AM, please contact night-coverage at [email or web address], Office  [phone]  password [email or web address] 01/31/2016, 12:48 PM  LOS: 3 days

## 2016-01-31 NOTE — Progress Notes (Signed)
Informed MD of residual of 150 at this time.  Pt complaining of feeling full.  Dr. Darrick Meigs stated to hold tube feed for 3 hours then resume at 40/hr with the same rate and amount for water bolus.  Will continue to monitor.  Iantha Fallen RN 6:44 PM 01/31/2016

## 2016-01-31 NOTE — Progress Notes (Signed)
Pharmacy Antibiotic Note  Cristian Harding is a 75 y.o. male admitted on 01/28/2016 with aspiration PNA.  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 He has been doing more frequent suctioning recently and has been having worsening cough and hoarseness. No chest pain, fever or chills reported. He was treated recently for a pneumonia from (last month).  Plan:  Levaquin 750mg  IV q24h as the pt is on continuous feeds, which interact with the oral form.  Pt doesn't take continuous feeds at home and may be switched to the oral form (same dose) at discharge if still needed.   Height: 5\' 9"  (175.3 cm) Weight: 158 lb (71.7 kg) IBW/kg (Calculated) : 70.7  Temp (24hrs), Avg:98.2 F (36.8 C), Min:98 F (36.7 C), Max:98.5 F (36.9 C)   Recent Labs Lab 01/28/16 1215 01/29/16 0310  WBC 3.3* 6.5  CREATININE 0.89 0.86    Estimated Creatinine Clearance: 74.2 mL/min (by C-G formula based on SCr of 0.86 mg/dL).    No Known Allergies  Antimicrobials this admission: vancomycin 11/1 >> 11/4 Zosyn 11/1 >> 11/4 Levaquin 11/4 >>  Dose adjustments this admission: ---  Microbiology results: 11/1 Sputum: sent  11/1 MRSA PCR: positive   Thank you for allowing pharmacy to be a part of this patient's care.  Dolly Rias RPh 01/31/2016, 8:15 AM Pager [phone]

## 2016-02-01 ENCOUNTER — Inpatient Hospital Stay (HOSPITAL_COMMUNITY): Payer: Medicare Other

## 2016-02-01 DIAGNOSIS — R339 Retention of urine, unspecified: Secondary | ICD-10-CM

## 2016-02-01 DIAGNOSIS — N179 Acute kidney failure, unspecified: Secondary | ICD-10-CM

## 2016-02-01 DIAGNOSIS — R1110 Vomiting, unspecified: Secondary | ICD-10-CM

## 2016-02-01 LAB — CBC
HCT: 31.5 % — ABNORMAL LOW (ref 39.0–52.0)
Hemoglobin: 10.3 g/dL — ABNORMAL LOW (ref 13.0–17.0)
MCH: 30.8 pg (ref 26.0–34.0)
MCHC: 32.7 g/dL (ref 30.0–36.0)
MCV: 94.3 fL (ref 78.0–100.0)
PLATELETS: 158 10*3/uL (ref 150–400)
RBC: 3.34 MIL/uL — ABNORMAL LOW (ref 4.22–5.81)
RDW: 12.7 % (ref 11.5–15.5)
WBC: 5.7 10*3/uL (ref 4.0–10.5)

## 2016-02-01 LAB — BASIC METABOLIC PANEL
Anion gap: 8 (ref 5–15)
BUN: 25 mg/dL — AB (ref 6–20)
CALCIUM: 9.2 mg/dL (ref 8.9–10.3)
CO2: 30 mmol/L (ref 22–32)
CREATININE: 1.76 mg/dL — AB (ref 0.61–1.24)
Chloride: 99 mmol/L — ABNORMAL LOW (ref 101–111)
GFR calc Af Amer: 42 mL/min — ABNORMAL LOW (ref >60)
GFR, EST NON AFRICAN AMERICAN: 36 mL/min — AB (ref >60)
GLUCOSE: 118 mg/dL — AB (ref 65–99)
Potassium: 4 mmol/L (ref 3.5–5.1)
SODIUM: 137 mmol/L (ref 135–145)

## 2016-02-01 MED ORDER — JEVITY 1.5 CAL/FIBER PO LIQD
1000.0000 mL | ORAL | Status: DC
  Administered 2016-02-02 (×3): 1000 mL
  Filled 2016-02-01 (×2): qty 1000

## 2016-02-01 MED ORDER — LEVOFLOXACIN IN D5W 750 MG/150ML IV SOLN
750.0000 mg | INTRAVENOUS | Status: DC
  Administered 2016-02-02 – 2016-02-04 (×2): 750 mg via INTRAVENOUS
  Filled 2016-02-01 (×3): qty 150

## 2016-02-01 MED ORDER — TAMSULOSIN HCL 0.4 MG PO CAPS
0.4000 mg | ORAL_CAPSULE | Freq: Every day | ORAL | Status: DC
Start: 2016-02-01 — End: 2016-02-04
  Administered 2016-02-01 – 2016-02-04 (×4): 0.4 mg via ORAL
  Filled 2016-02-01 (×4): qty 1

## 2016-02-01 MED ORDER — HYDRALAZINE HCL 25 MG PO TABS
25.0000 mg | ORAL_TABLET | Freq: Three times a day (TID) | ORAL | Status: DC
  Administered 2016-02-01 – 2016-02-03 (×5): 25 mg
  Filled 2016-02-01 (×5): qty 1

## 2016-02-01 MED ORDER — SODIUM CHLORIDE 0.9 % IV SOLN
INTRAVENOUS | Status: AC
  Administered 2016-02-01: 75 mL/h via INTRAVENOUS

## 2016-02-01 MED ORDER — MORPHINE SULFATE (CONCENTRATE) 10 MG/0.5ML PO SOLN
10.0000 mg | ORAL | Status: DC | PRN
  Administered 2016-02-01 – 2016-02-04 (×8): 10 mg
  Filled 2016-02-01 (×8): qty 0.5

## 2016-02-01 NOTE — Progress Notes (Signed)
BP 191/80.  25 mg Hydralazine per tube given per MD prn order.

## 2016-02-01 NOTE — Progress Notes (Signed)
Pharmacy Antibiotic Note  Cristian Harding is a 75 y.o. male admitted on 01/28/2016 with aspiration PNA.  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 He has been doing more frequent suctioning recently and has been having worsening cough and hoarseness. No chest pain, fever or chills reported. He was treated recently for a pneumonia from (last month).  Plan:  Decrease Levaquin 750mg  IV to q48h for CrCl 30-72mls/min   Follow renal function for adjustment   Height: 5\' 9"  (175.3 cm) Weight: 158 lb (71.7 kg) IBW/kg (Calculated) : 70.7  Temp (24hrs), Avg:98.3 F (36.8 C), Min:98 F (36.7 C), Max:99 F (37.2 C)   Recent Labs Lab 01/28/16 1215 01/29/16 0310 02/01/16 0338  WBC 3.3* 6.5 5.7  CREATININE 0.89 0.86 1.76*    Estimated Creatinine Clearance: 36.3 mL/min (by C-G formula based on SCr of 1.76 mg/dL (H)).    No Known Allergies  Antimicrobials this admission: vancomycin 11/1 >> 11/4 Zosyn 11/1 >> 11/4 Levaquin 11/4 >>  Dose adjustments this admission: ---  Microbiology results: 11/1 Sputum: sent  11/1 MRSA PCR: positive   Thank you for allowing pharmacy to be a part of this patient's care.  Dolly Rias RPh 02/01/2016, 9:16 AM Pager [phone]

## 2016-02-01 NOTE — Progress Notes (Signed)
RN paused pt. Tube feeding due to pt. Complaints of feeling too full. Will continue to monitor.

## 2016-02-01 NOTE — Progress Notes (Addendum)
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 He has been doing more frequent suctioning recently and has been having worsening cough and hoarseness. No chest pain, fever or chills reported. He was treated recently for a pneumonia from (last month). In the ER, his sat was low to 70s and he had two episodes of vomiting and difficulty passing medications through the PEG tube. There is a suspicion that he has gastric outlet obstruction. In one episode , bloody fluid was aspirated but no coffee ground or clots. No abdominal pain or diarrhea. Last BM was yesterday. He gets nothing orally, all intake is via PEG tube and last meal was this morning.    Subjective   Patient seen and examined,Breathing has improved. BMP this morning shows worsening creatinine.   Assessment/Plan:     1. Healthcare associated pneumonia- patchy infiltrate in the left lower lobe, question aspiration as per CT chest. Patient was started on  vancomycin and Zosyn.Continue on IV Levaquin. Robitussin 400 mg every 4 hours. Suction when necessary 2. Acute kidney injury- unclear cause at this time, today creatinine is 1.76, it was 0.86 on 01/29/16. Patient also has elevated BUN 25. Likely dehydration, we'll also check a renal ultrasound as patient has urinary retention. Follow BMP in a.m. 3. Vomiting- resolved, patient had large gastric residual, consulted IR for PEG tube malfunction, PEG tube functioning fine. Also checked abdominal x-ray which showed nonobstructive bowel gas pattern. 4. Urinary retention- patient developed urinary retention in the hospital had large residual urine on bladder scan, Foley catheter has been placed. I called and discussed with  urologist Dr. Jeffie Pollock, who recommends to discharge patient home on Foley catheter and follow-up with urology in 2 weeks. Renal ultrasound has been ordered as above. We'll start tamsulosin 0.4 mg daily. 5. Nutrition- patient's wife says  that she has been giving 2 cans of Jevity twice a day, nutrition was consulted and Jevity rate increased to 50 mL per hour in the hospital.Home regimen recommendation: 1 can Jevity 1.5 every 3 hours (5 cans/day). Flush with 100 mL free water before and 100 mL free water after each TF bolus.  6. Hypertension hydralazine when necessary 7. Pulmonary nodule- CT chest shows pulmonary noduleSemi-solid nodular opacity posterior segment right upper        lobe measuring 1.0 x 0.6 cm. noncontrast CT in 3-6 months is recommended to check pulmonary nodules. 6.    Aortic aneurysm- Ascending thoracic aorta measures 4.4 x 4.4 cm. No dissection, and will CTA or MRA recommended for screening.     DVT prophylaxis: Lovenox  Code Status: Full code  Family Communication: Discussed with patient's daughter and wife at bedside  on 01/29/2016  Disposition Plan: home in next 24- 48 hrs   Consultants:  None   Procedures:  None   Antibiotics:   Anti-infectives    Start     Dose/Rate Route Frequency Ordered Stop   02/02/16 1000  levofloxacin (LEVAQUIN) IVPB 750 mg     750 mg 100 mL/hr over 90 Minutes Intravenous Every 48 hours 02/01/16 0914     01/31/16 1000  levofloxacin (LEVAQUIN) IVPB 750 mg  Status:  Discontinued     750 mg 100 mL/hr  over 90 Minutes Intravenous Every 24 hours 01/31/16 0830 02/01/16 0914   01/29/16 0800  vancomycin (VANCOCIN) IVPB 1000 mg/200 mL premix  Status:  Discontinued     1,000 mg 200 mL/hr over 60 Minutes Intravenous Every 12 hours 01/28/16 1949 01/31/16 0752   01/28/16 2000  piperacillin-tazobactam (ZOSYN) IVPB 3.375 g  Status:  Discontinued     3.375 g 12.5 mL/hr over 240 Minutes Intravenous Every 8 hours 01/28/16 1949 01/31/16 0752    01/28/16 2000  vancomycin (VANCOCIN) 1,500 mg in sodium chloride 0.9 % 500 mL IVPB     1,500 mg 250 mL/hr over 120 Minutes Intravenous  Once 01/28/16 1949 01/28/16 2224       Objective   Vitals:   02/01/16 0700 02/01/16 0800 02/01/16 0839 02/01/16 0900  BP: (!) 147/78 (!) 169/70  (!) 169/72  Pulse: 76 79  81  Resp: 11 13  12   Temp:      TempSrc:      SpO2: 94% 94% 95% 94%  Weight:      Height:        Intake/Output Summary (Last 24 hours) at 02/01/16 1048 Last data filed at 02/01/16 0900  Gross per 24 hour  Intake             1820 ml  Output             1700 ml  Net              120 ml   Filed Weights   01/28/16 1046  Weight: 71.7 kg (158 lb)     Physical Examination:  General exam: Appears calm and comfortable. Respiratory system: Trach collar in place, Clear to auscultation. Respiratory effort normal. Cardiovascular system:  RRR. No  murmurs, rubs, gallops. No pedal edema. GI system: Abdomen is nondistended, soft and nontender. No organomegaly. Peg tube in place. Central nervous system. No focal neurological deficits. 5 x 5 power in all extremities. Skin: No rashes, lesions or ulcers. Psychiatry: Alert, oriented x 3.Judgement and insight appear normal. Affect normal.    Data Reviewed: I have personally reviewed following labs and imaging studies  CBG: No results for input(s): GLUCAP in the last 168 hours.  CBC:  Recent Labs Lab 01/28/16 1215 01/29/16 0310 02/01/16 0338  WBC 3.3* 6.5 5.7  NEUTROABS 2.3  --   --   HGB 9.7* 9.7* 10.3*  HCT 30.0* 29.7* 31.5*  MCV 97.4 94.9 94.3  PLT 153 178 0000000    Basic Metabolic Panel:  Recent Labs Lab 01/28/16 1215 01/29/16 0310 02/01/16 0338  NA 138 139 137  K 4.1 4.4 4.0  CL 98* 100* 99*  CO2 35* 32 30  GLUCOSE 140* 110* 118*  BUN 21* 18 25*  CREATININE 0.89 0.86 1.76*  CALCIUM 9.5 9.4 9.2    Recent Results (from the past 240 hour(s))  MRSA PCR Screening     Status: Abnormal   Collection Time:  01/28/16  8:45 PM  Result Value Ref Range Status   MRSA by PCR POSITIVE (A) NEGATIVE Final    Comment:        The GeneXpert MRSA Assay (FDA approved for NASAL specimens only), is one component of a comprehensive MRSA colonization surveillance program. It is not intended to diagnose MRSA infection nor to guide or monitor treatment for MRSA infections. RESULT CALLED TO, READ BACK BY AND VERIFIED WITH: BEN MADDOX,RN [id number] @ 2237 BY J SCOTTON      Liver Function Tests:  Recent  Labs Lab 01/29/16 0310  AST 25  ALT 26  ALKPHOS 125  BILITOT 0.5  PROT 7.0  ALBUMIN 3.5    Cardiac Enzymes:  Recent Labs Lab 01/28/16 1215  TROPONINI <0.03   BNP (last 3 results) No results for input(s): BNP in the last 8760 hours.  ProBNP (last 3 results) No results for input(s): PROBNP in the last 8760 hours.    Studies: No results found.  Scheduled Meds: . Chlorhexidine Gluconate Cloth  6 each Topical Q0600  . enoxaparin (LOVENOX) injection  40 mg Subcutaneous Q24H  . fentaNYL  75 mcg Transdermal Q72H  . free water  200 mL Per Tube QID  . glycopyrrolate  1 mg Oral BID  . guaiFENesin  20 mL Oral Q4H  . ipratropium-albuterol  3 mL Nebulization TID  . [START ON 02/02/2016] levofloxacin (LEVAQUIN) IV  750 mg Intravenous Q48H  . levothyroxine  50 mcg Intravenous QAC breakfast  . metoCLOPramide  10 mg Oral TID AC  . metoprolol tartrate  25 mg Oral BID  . mupirocin ointment  1 application Nasal BID  . pantoprazole sodium  40 mg Per Tube Daily  . tamsulosin  0.4 mg Oral QPC breakfast  . traZODone  100 mg Per J Tube QHS    Continuous Infusions: . sodium chloride    . feeding supplement (JEVITY 1.5 CAL/FIBER) 1,000 mL (01/31/16 1852)    Time spent: 25 min  [facility] Pager [phone]. If 7PM-7AM, please contact night-coverage at [email or web address], Office  [phone]  password [email or web address] 02/01/2016, 10:48 AM  LOS: 4 days

## 2016-02-01 NOTE — Progress Notes (Signed)
PT refusing to utilize ATC at this time (sp02 on ra 95%). ATC set up is available at bedside.

## 2016-02-02 LAB — BASIC METABOLIC PANEL
ANION GAP: 7 (ref 5–15)
BUN: 26 mg/dL — ABNORMAL HIGH (ref 6–20)
CALCIUM: 8.8 mg/dL — AB (ref 8.9–10.3)
CO2: 28 mmol/L (ref 22–32)
CREATININE: 1.68 mg/dL — AB (ref 0.61–1.24)
Chloride: 100 mmol/L — ABNORMAL LOW (ref 101–111)
GFR calc non Af Amer: 38 mL/min — ABNORMAL LOW (ref >60)
GFR, EST AFRICAN AMERICAN: 44 mL/min — AB (ref >60)
Glucose, Bld: 111 mg/dL — ABNORMAL HIGH (ref 65–99)
Potassium: 4.2 mmol/L (ref 3.5–5.1)
SODIUM: 135 mmol/L (ref 135–145)

## 2016-02-02 MED ORDER — SODIUM CHLORIDE 0.9 % IV SOLN
INTRAVENOUS | Status: DC
  Administered 2016-02-02: 13:00:00 via INTRAVENOUS

## 2016-02-02 MED ORDER — AMLODIPINE BESYLATE 5 MG PO TABS
5.0000 mg | ORAL_TABLET | Freq: Every day | ORAL | Status: DC
  Administered 2016-02-02 – 2016-02-04 (×3): 5 mg via ORAL
  Filled 2016-02-02 (×3): qty 1

## 2016-02-02 MED ORDER — CHLORHEXIDINE GLUCONATE CLOTH 2 % EX PADS
6.0000 | MEDICATED_PAD | Freq: Every day | CUTANEOUS | Status: AC
  Administered 2016-02-02 – 2016-02-03 (×2): 6 via TOPICAL

## 2016-02-02 MED ORDER — FREE WATER
50.0000 mL | Freq: Four times a day (QID) | Status: DC
  Administered 2016-02-02 – 2016-02-04 (×7): 50 mL

## 2016-02-02 MED ORDER — OSMOLITE 1.5 CAL PO LIQD
1000.0000 mL | ORAL | Status: DC
  Administered 2016-02-02 – 2016-02-03 (×2): 1000 mL
  Filled 2016-02-02 (×3): qty 1000

## 2016-02-02 MED ORDER — SODIUM CHLORIDE 0.9 % IV SOLN
INTRAVENOUS | Status: DC
  Administered 2016-02-02: 13:00:00 via INTRAVENOUS
  Filled 2016-02-02 (×2): qty 1000

## 2016-02-02 MED ORDER — SODIUM CHLORIDE 0.9 % IV SOLN: INTRAVENOUS | Status: DC

## 2016-02-02 NOTE — Progress Notes (Signed)
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 He has been doing more frequent suctioning recently and has been having worsening cough and hoarseness. No chest pain, fever or chills reported. He was treated recently for a pneumonia from (last month). In the ER, his sat was low to 70s and he had two episodes of vomiting and difficulty passing medications through the PEG tube. There is a suspicion that he has gastric outlet obstruction. In one episode , bloody fluid was aspirated but no coffee ground or clots. No abdominal pain or diarrhea. Last BM was yesterday. He gets nothing orally, all intake is via PEG tube and last meal was this morning.    Subjective   Patient seen and examined,complains of feeling full. Tube feeds stopped this morning.   Assessment/Plan:     1. Healthcare associated pneumonia- patchy infiltrate in the left lower lobe, question aspiration as per CT chest. Patient was started on  vancomycin and Zosyn.Continue on IV Levaquin. Robitussin 400 mg every 4 hours. Suction when necessary 2. Acute kidney injury- unclear cause at this time, today creatinine is 1.68, it was 0.86 on 01/29/16. Slight improvement with IV fluids. Likely dehydration, renal ultrasound showed no hydronephrosis. Follow BMP in a.m. 3. Vomiting- resolved, patient had large gastric residual, consulted IR for PEG tube malfunction, PEG tube functioning fine. Also checked abdominal x-ray which showed nonobstructive bowel gas pattern. 4. Urinary retention- patient developed urinary retention in the hospital had large residual urine on bladder scan, Foley catheter has been placed. I called and discussed with urologist Dr. Jeffie Pollock, who recommends to  discharge patient home on Foley catheter and follow-up with urology in 2 weeks. Renal ultrasound has been ordered as above. We'll start tamsulosin 0.4 mg daily. 5. Nutrition- patient's wife says  that she has been giving 2 cans of Jevity twice a day, nutrition was consulted and Jevity rate increased to 50 mL per hour in the hospital.Home regimen recommendation: 1 can Jevity 1.5 every 3 hours (5 cans/day). Flush with 100 mL free water before and 100 mL free water after each TF bolus. Nutrition to follow for recommendations. 6. Hypertension - blood pressure is elevated, started on metoprolol 25 minute grams twice a day. Will also add amlodipine 5 mg daily. Hydralazine 25 mg every 8 hours. 7. Pulmonary nodule- CT chest shows pulmonary noduleSemi-solid nodular opacity posterior segment right upper        lobe measuring 1.0 x 0.6 cm. noncontrast CT in 3-6 months is recommended to check pulmonary nodules. 6.    Aortic aneurysm- Ascending thoracic aorta measures 4.4 x 4.4 cm. No dissection, and will CTA or MRA recommended for screening.     DVT prophylaxis: Lovenox  Code Status: Full code  Family Communication: Discussed with patient's daughter and wife at bedside  on 01/29/2016  Disposition Plan: home in next 24- 48 hrs   Consultants:  None   Procedures:  None   Antibiotics:   Anti-infectives    Start     Dose/Rate Route Frequency Ordered Stop   02/02/16 1000  levofloxacin (LEVAQUIN) IVPB 750 mg     750 mg 100 mL/hr over 90 Minutes Intravenous Every 48 hours 02/01/16 0914  01/31/16 1000  levofloxacin (LEVAQUIN) IVPB 750 mg  Status:  Discontinued     750 mg 100 mL/hr over 90 Minutes Intravenous Every 24 hours 01/31/16 0830 02/01/16 0914   01/29/16 0800  vancomycin (VANCOCIN) IVPB 1000 mg/200 mL premix  Status:  Discontinued     1,000 mg 200 mL/hr over 60 Minutes Intravenous Every 12 hours 01/28/16 1949 01/31/16 0752   01/28/16 2000  piperacillin-tazobactam (ZOSYN) IVPB 3.375 g   Status:  Discontinued     3.375 g 12.5 mL/hr over 240 Minutes Intravenous Every 8 hours 01/28/16 1949 01/31/16 0752   01/28/16 2000  vancomycin (VANCOCIN) 1,500 mg in sodium chloride 0.9 % 500 mL IVPB     1,500 mg 250 mL/hr over 120 Minutes Intravenous  Once 01/28/16 1949 01/28/16 2224       Objective   Vitals:   02/02/16 0600 02/02/16 0700 02/02/16 0800 02/02/16 0900  BP: (!) 177/84 (!) 173/68 (!) 170/122 (!) 171/67  Pulse: 88 75 71 72  Resp: 16 (!) 22 15 14   Temp:   99.1 F (37.3 C)   TempSrc:   Oral   SpO2: 96% 97% 99% 99%  Weight:      Height:        Intake/Output Summary (Last 24 hours) at 02/02/16 1059 Last data filed at 02/02/16 0900  Gross per 24 hour  Intake          2313.33 ml  Output              840 ml  Net          1473.33 ml   Filed Weights   01/28/16 1046  Weight: 71.7 kg (158 lb)     Physical Examination:  General exam: Appears calm and comfortable. Respiratory system: Trach collar in place, Clear to auscultation. Respiratory effort normal. Cardiovascular system:  RRR. No  murmurs, rubs, gallops. No pedal edema. GI system: Abdomen is nondistended, soft and nontender. No organomegaly. Peg tube in place. Central nervous system. No focal neurological deficits. 5 x 5 power in all extremities. Skin: No rashes, lesions or ulcers. Psychiatry: Alert, oriented x 3.Judgement and insight appear normal. Affect normal.    Data Reviewed: I have personally reviewed following labs and imaging studies  CBG: No results for input(s): GLUCAP in the last 168 hours.  CBC:  Recent Labs Lab 01/28/16 1215 01/29/16 0310 02/01/16 0338  WBC 3.3* 6.5 5.7  NEUTROABS 2.3  --   --   HGB 9.7* 9.7* 10.3*  HCT 30.0* 29.7* 31.5*  MCV 97.4 94.9 94.3  PLT 153 178 0000000    Basic Metabolic Panel:  Recent Labs Lab 01/28/16 1215 01/29/16 0310 02/01/16 0338 02/02/16 0332  NA 138 139 137 135  K 4.1 4.4 4.0 4.2  CL 98* 100* 99* 100*  CO2 35* 32 30 28  GLUCOSE 140*  110* 118* 111*  BUN 21* 18 25* 26*  CREATININE 0.89 0.86 1.76* 1.68*  CALCIUM 9.5 9.4 9.2 8.8*    Recent Results (from the past 240 hour(s))  MRSA PCR Screening     Status: Abnormal   Collection Time: 01/28/16  8:45 PM  Result Value Ref Range Status   MRSA by PCR POSITIVE (A) NEGATIVE Final    Comment:        The GeneXpert MRSA Assay (FDA approved for NASAL specimens only), is one component of a comprehensive MRSA colonization surveillance program. It is not intended to diagnose MRSA infection nor to guide or monitor treatment for  MRSA infections. RESULT CALLED TO, READ BACK BY AND VERIFIED WITH: BEN MADDOX,RN [id number] @ 2237 BY J SCOTTON      Liver Function Tests:  Recent Labs Lab 01/29/16 0310  AST 25  ALT 26  ALKPHOS 125  BILITOT 0.5  PROT 7.0  ALBUMIN 3.5    Cardiac Enzymes:  Recent Labs Lab 01/28/16 1215  TROPONINI <0.03   BNP (last 3 results) No results for input(s): BNP in the last 8760 hours.  ProBNP (last 3 results) No results for input(s): PROBNP in the last 8760 hours.    Studies: US Renal  Result Date: 02/01/2016 CLINICAL DATA:  Urinary retention.  Acute kidney injury. EXAM: RENAL / URINARY TRACT ULTRASOUND COMPLETE COMPARISON:  None. FINDINGS: Right Kidney: Length: 11.8 cm. Mildly increased renal parenchymal echogenicity. No mass or hydronephrosis visualized. Left Kidney: Length: 11.0 cm. Mildly increased renal parenchymal echogenicity. No mass or hydronephrosis visualized. Bladder: Nearly completely empty with Foley catheter seen in place. IMPRESSION: Normal size kidneys with increased parenchymal echogenicity, consistent with medical renal disease. No evidence of hydronephrosis. Nearly empty bladder, with Foley catheter in place. Electronically Signed   By: Earle Gell M.D.   On: 02/01/2016 15:18    Scheduled Meds: . amLODipine  5 mg Oral Daily  . Chlorhexidine Gluconate Cloth  6 each Topical Q0600  . enoxaparin (LOVENOX) injection  40 mg  Subcutaneous Q24H  . feeding supplement (JEVITY 1.5 CAL/FIBER)  1,000 mL Per Tube Q24H  . fentaNYL  75 mcg Transdermal Q72H  . free water  200 mL Per Tube QID  . glycopyrrolate  1 mg Oral BID  . guaiFENesin  20 mL Oral Q4H  . hydrALAZINE  25 mg Per Tube Q8H  . ipratropium-albuterol  3 mL Nebulization TID  . levofloxacin (LEVAQUIN) IV  750 mg Intravenous Q48H  . levothyroxine  50 mcg Intravenous QAC breakfast  . metoCLOPramide  10 mg Oral TID AC  . metoprolol tartrate  25 mg Oral BID  . mupirocin ointment  1 application Nasal BID  . pantoprazole sodium  40 mg Per Tube Daily  . tamsulosin  0.4 mg Oral QPC breakfast  . traZODone  100 mg Per J Tube QHS    Continuous Infusions:   Time spent: 25 min  [facility] Pager [phone]. If 7PM-7AM, please contact night-coverage at [email or web address], Office  [phone]  password [email or web address] 02/02/2016, 10:59 AM  LOS: 5 days

## 2016-02-02 NOTE — Progress Notes (Signed)
NUTRITION NOTE  Spoke with RN ~30 minutes ago. She reports that after preparing to hang a new bottle of Jevity 1.5, pt reported that he would like to try a fiber-free TF formula. Informed RN to initiate Osmolite 1.5 @ 20 mL/hr and increase to 30 mL/hr after 4 hours.   RD will follow-up tomorrow. Will place new TF order at this time.   Cristian Matin, MS, RD, LDN Inpatient Clinical Dietitian Pager # [phone] After hours/weekend pager # [phone]

## 2016-02-02 NOTE — Progress Notes (Signed)
Called by RN that patient stated he couldn't breath right. Upon arrival pt was asleep, pt was woken up and he stated he felt like he couldn't breath. BBS were slightly diminished. Patient was suctioned multiple times and received small Lessa Huge, tan sputum. Pt had same complaint, RT bag lavaged patient and still received very scant Skylan Lara, tan sputum. Inner cannula was checked and there was no plugs or sputum clogging airway. Patient is on 28% ATC. BBS are still clear throughout, no distress and stable throughout. HR 78 RR 18 SPO2 96% on 28% ATC. Once RT stepped out of the room to discuss with RN patient went back to sleep.

## 2016-02-02 NOTE — Progress Notes (Signed)
Date:  February 02, 2016 Chart reviewed for concurrent status and case management needs. Will continue to follow the patient for status change: Discharge Planning: following for needs Expected discharge date: [id number] Fathima Bartl, BSN, [place], [place]

## 2016-02-02 NOTE — Progress Notes (Signed)
Nutrition Follow-up  DOCUMENTATION CODES:   Not applicable  INTERVENTION:  - Continue Jevity 1.5 @ 30 mL/hr today. This regimen provides 1080 kcal, 46 grams of protein, and 550 mL free water.  - Will free water flush to 50 mL QID given current IVF.  - Goal for TF during hospitalization: Jevity 1.5 @ 50 mL/hr. This regimen will provide 1800 kcal, 77 grams of protein, and 912 mL free water. - RD will follow-up 11/7.  NUTRITION DIAGNOSIS:   Swallowing difficulty related to dysphagia as evidenced by per patient/family report. -ongoing  GOAL:   Patient will meet greater than or equal to 90% of their needs -unmet with current TF rate.   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 He has been doing more frequent suctioning recently and has been having worsening cough and hoarseness. No chest pain, fever or chills reported. He was treated recently for a pneumonia from (last month). In the ER, his sat was low to 70s and he had two episodes of vomiting and difficulty passing medications through the PEG tube. There is a suspicion that he has gastric outlet obstruction. In one episode , bloody fluid was aspirated but no coffee ground or clots. No abdominal pain or diarrhea. Last BM was yesterday. He gets nothing orally, all intake is via PEG tube and last meal was this morning.   11/6 Pt with trach and PEG and order in place for Jevity 1.5 @ 50 mL/hr pt has reached maximum rate of Jevity 1.5 @ 30 mL/hr and reports feelings of fullness/discomfort. TF was held from [place] yesterday to 0430 this AM and then TF restarted at that time. Around 0800 this AM pt requested TF again be turned off d/t feelings of fullness. Spoke with pt who reports that he feels he is receiving too much TF per day.  Explained to pt that at home he was receiving 5 cans/day and current rate (Jevity 1.5 @ 30 mL/hr) is equivalent to 3 cans of TF. Pt also reports that he has had ~3x as much stool output as he typically does at home. Talked with pt about trial of fiber-free TF formula today to assess if signs/symptoms improve. Pt states he was on a similar formula shortly after PEG was placed. He was switched from that formula to Jevity d/t lack of BMs; bowel habits improved with the switch to a fiber-containing formula. Pt would prefer to remain on Jevity today but is agreeable to re-assessing tomorrow if he is unable to d/c tomorrow (he states he is to d/c tomorrow).   Spoke with RN shortly after talking with pt. RN had re-started TF and 5-10 minutes later pt complained of feelings of fullness returning. Provided RN with information from RD and pt discussion and encouraged RN to alert RD if pt requests TF be turned off again and RD will re-assess at that time. No new weight since 01/28/16.  Medications reviewed; 50 mcg IV Synthroid/day, 10 mg Reglan per PEG TID, PRN IV Zofran, 40 mg Protonix/day, PRN Miralax.  Labs reviewed; Cl: 100 mmol/L, BUN: 26 mg/dL, creatinine: 1.68 mg/dL, Ca: 8.8 mg/dL, GFR: 38 mL/min.  IVF: NS @ 100 mL/hr.    11/2 - Pt with trach and PEG at baseline.  -  Pt contributes to the conversation intermittently but wife and daughter provide most of information. - He does not take anything PO and all nutrition and medications are provided via PEG, per wife's report.  - Pt has had PEG x8 years and has been on current TF regimen x7 years: 2 cans Jevity 1.5 BID and 1 can Jevity 1.5 once/day (5 cans/day total).  - This regimen provides 1775 kcal, 76 grams of protein, and 900 mL free water.  - Between flushes for medications + before and after boluses of TF, pt receives an additional 40 ounce (1183 mL) free water/day. - Recently, pt has not been feeling well so he has only been taking 2 cans of Jevity 1.5  BID which provided 1420 kcal, 60 grams of protein, and 720 mL free water.  - Wife reports that episode of high gastric residual and vomiting large amount of TF from trach has never occurred.  - She states that pt does have reflux and that he requires Reglan 30 minutes prior to all TF boluses.  - He intermittently has some TF-tinged secretions but these are very small in amount.  - PTA pt's PCP had recommended pt be provided with 1 can of Jevity 1.5 five times/day for 1 week to assess if he "felt better."  - Wife and pt report that pt did not notice an overt difference with this.  - Explained that although a difference may not be felt, decreasing TF bolus volume may be beneficial given current dx and the need for resolution of possible (aspiration) PNA.  - Discussed continuing 1 can of Jevity 1.5 five times daily at home home until medical personnel feel that PNA has fully resolved.  - Wife reports that pt's weight has been stable over the past few years.  - No previous weight hx available in the chart.  - Unable to perform physical assessment.   Diet Order:  Diet NPO time specified  Skin:  Reviewed, no issues  Last BM:  11/4  Height:   Ht Readings from Last 1 Encounters:  01/28/16 5\' 9"  (1.753 m)    Weight:   Wt Readings from Last 1 Encounters:  01/28/16 158 lb (71.7 kg)    Ideal Body Weight:  72.73 kg  BMI:  Body mass index is 23.33 kg/m.  Estimated Nutritional Needs:   Kcal:  [id number] (22-26 kcal/kg)  Protein:  65-75 grams  Fluid:  >/= 2 L/day  EDUCATION NEEDS:   No education needs identified at this time    Jarome Matin, MS, RD, LDN Inpatient Clinical Dietitian Pager # [phone] After hours/weekend pager # [phone]

## 2016-02-02 NOTE — Progress Notes (Signed)
PT Cancellation Note  Patient Details Name: Cristian Harding MRN: [id number] DOB: 07/28/1940   Cancelled Treatment:    Reason Eval/Treat Not Completed: Pain limiting ability to participate   Weston Anna, MPT Pager: [phone]

## 2016-02-03 ENCOUNTER — Inpatient Hospital Stay (HOSPITAL_COMMUNITY): Payer: Medicare Other

## 2016-02-03 DIAGNOSIS — J189 Pneumonia, unspecified organism: Secondary | ICD-10-CM

## 2016-02-03 LAB — GLUCOSE, CAPILLARY: GLUCOSE-CAPILLARY: 133 mg/dL — AB (ref 65–99)

## 2016-02-03 LAB — BASIC METABOLIC PANEL
Anion gap: 7 (ref 5–15)
BUN: 26 mg/dL — ABNORMAL HIGH (ref 6–20)
CHLORIDE: 101 mmol/L (ref 101–111)
CO2: 27 mmol/L (ref 22–32)
CREATININE: 1.42 mg/dL — AB (ref 0.61–1.24)
Calcium: 8.6 mg/dL — ABNORMAL LOW (ref 8.9–10.3)
GFR calc non Af Amer: 47 mL/min — ABNORMAL LOW (ref >60)
GFR, EST AFRICAN AMERICAN: 54 mL/min — AB (ref >60)
Glucose, Bld: 115 mg/dL — ABNORMAL HIGH (ref 65–99)
POTASSIUM: 4 mmol/L (ref 3.5–5.1)
SODIUM: 135 mmol/L (ref 135–145)

## 2016-02-03 LAB — PROCALCITONIN: Procalcitonin: 0.27 ng/mL

## 2016-02-03 MED ORDER — LEVOFLOXACIN 750 MG PO TABS: 750.0000 mg | ORAL_TABLET | ORAL | 0 refills | Status: DC

## 2016-02-03 MED ORDER — METOPROLOL TARTRATE 25 MG PO TABS: 25.0000 mg | ORAL_TABLET | Freq: Two times a day (BID) | ORAL | 2 refills | Status: AC

## 2016-02-03 MED ORDER — AMLODIPINE BESYLATE 5 MG PO TABS: 5.0000 mg | ORAL_TABLET | Freq: Every day | ORAL | 2 refills | Status: AC

## 2016-02-03 MED ORDER — TAMSULOSIN HCL 0.4 MG PO CAPS: 0.4000 mg | ORAL_CAPSULE | Freq: Every day | ORAL | 2 refills | Status: AC

## 2016-02-03 MED ORDER — IPRATROPIUM-ALBUTEROL 0.5-2.5 (3) MG/3ML IN SOLN
3.0000 mL | Freq: Four times a day (QID) | RESPIRATORY_TRACT | Status: DC
  Administered 2016-02-03 – 2016-02-04 (×3): 3 mL via RESPIRATORY_TRACT
  Filled 2016-02-03 (×3): qty 3

## 2016-02-03 MED ORDER — HYDRALAZINE HCL 25 MG PO TABS: 25.0000 mg | ORAL_TABLET | Freq: Four times a day (QID) | ORAL | Status: DC | PRN

## 2016-02-03 MED ORDER — JEVITY 1.5 CAL PO LIQD: ORAL | 0 refills | Status: AC

## 2016-02-03 NOTE — Consult Note (Addendum)
Name: Zahmari Milkey MRN: [id number] DOB: October 24, 1940    ADMISSION DATE:  01/28/2016 CONSULTATION DATE:  11/7  REFERRING MD :  Dr. 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 He was admitted 01/28/2016 for healthcare associated pneumonia. He was initially treated with vancomycin and Zosyn which is since been transitioned to levofloxacin. There was concern for gastric outlet obstruction and aspiration, however, this was not worked up as an inpatient. 02/03/2016 he was slated for discharge, however, he had increased sputum production. Chest x-ray was concerning for left lower lobe opacification. PCCM consulted for further evaluation.  SIGNIFICANT EVENTS  11/1 admit to [facility] for PNA 11/7 increased productive cough, PCCM consult  STUDIES:  CT chest 11/1 > Patchy infiltrate left lower lobe. Question aspiration given the clinical history. Ground-glass opacity anterior segment right upper lobe measuring 1.2 x 0.7 cm. Semi-solid nodular opacity posterior segment right upper lobe measuring 1.0 x 0.6 cm. Non-contrast chest CT at 3-6 months is recommended. If nodules persist, subsequent management will be based upon the most suspicious nodule(s). Mildly prominent lymph nodes in the precarinal and sub- carinal regions. Question reactive etiology. Scarring with bronchiolectasis in both lung apices. There is bronchiectasis in the upper and lower lobe regions as well. Ascending thoracic aorta measures 4.4 x 4.4 cm. No dissection.   PAST MEDICAL HISTORY :   has a past medical history of Cancer ([place]) and Thyroid disease.  has a past surgical history that includes Tracheostomy and Gastrostomy w/ feeding tube. Prior to Admission medications   Medication Sig Start Date End Date Taking? Authorizing Provider  acetaminophen (TYLENOL) 160 MG/5ML liquid Place 960 mg into feeding  tube every 4 (four) hours as needed for fever.    Yes Historical Provider, MD  diazepam (VALIUM) 10 MG tablet Place 10 mg into feeding tube 3 (three) times daily as needed for anxiety.    Yes Historical Provider, MD  dicyclomine (BENTYL) 20 MG tablet Place 20 mg into feeding tube 3 (three) times daily.   Yes Historical Provider, MD  fentaNYL (DURAGESIC - DOSED MCG/HR) 75 MCG/HR Place 75 mcg onto the skin every 3 (three) days.   Yes Historical Provider, MD  glycopyrrolate (ROBINUL) 1 MG tablet Place 1 mg into feeding tube 2 (two) times daily.    Yes Historical Provider, MD  guaifenesin (ROBITUSSIN) 100 MG/5ML syrup Place 300 mg into feeding tube 3 (three) times daily as needed (thinning mucus.).    Yes Historical Provider, MD  levothyroxine (SYNTHROID, LEVOTHROID) 75 MCG tablet Place 75 mcg into feeding tube daily before breakfast.    Yes Historical Provider, MD  metoCLOPramide (REGLAN) 5 MG/5ML solution Place 10 mg into feeding tube 3 (three) times daily before meals.    Yes Historical Provider, MD  morphine 10 MG/5ML solution Place 10-20 mg into feeding tube every 4 (four) hours as needed for severe pain (breakthrough pain.).    Yes Historical Provider, MD  promethazine (PHENERGAN) 25 MG tablet Place 25 mg into feeding tube every 8 (eight) hours as needed for nausea or vomiting.    Yes Historical Provider, MD  traZODone (DESYREL) 50 MG tablet Place 100 mg into feeding tube at bedtime.    Yes Historical Provider, MD  amLODipine (NORVASC) 5 MG tablet Take 1 tablet (5 mg total) by mouth daily. 02/04/16   Oswald Hillock, MD  levofloxacin (LEVAQUIN) 750 MG tablet Take  1 tablet (750 mg total) by mouth every other day. 02/03/16 02/08/16  Oswald Hillock, MD  metoprolol tartrate (LOPRESSOR) 25 MG tablet Take 1 tablet (25 mg total) by mouth 2 (two) times daily. 02/03/16   Oswald Hillock, MD  Nutritional Supplements (FEEDING SUPPLEMENT, JEVITY 1.5 CAL,) LIQD 1 can Jevity 1.5 every 3 hours (5 cans/day). Flush with 100 mL  free water before and 100 mL free water after each TF bolus. Nutrition to follow for recommendations. 02/03/16   Oswald Hillock, MD  tamsulosin (FLOMAX) 0.4 MG CAPS capsule Take 1 capsule (0.4 mg total) by mouth daily after breakfast. 02/04/16   Oswald Hillock, MD   No Known Allergies  FAMILY HISTORY:  family history is not on file. SOCIAL HISTORY:  reports that he has never smoked. He has never used smokeless tobacco. He reports that he does not drink alcohol.  REVIEW OF SYSTEMS:   Bolds are positive  Constitutional: weight loss, gain, night sweats, Fevers, chills, fatigue .  HEENT: headaches, Sore throat, sneezing, nasal congestion, post nasal drip, Difficulty swallowing, Tooth/dental problems, visual complaints visual changes, ear ache CV:  chest pain, radiates: ,Orthopnea, PND, swelling in lower extremities, dizziness, palpitations, syncope.  GI  heartburn, indigestion, abdominal pain, nausea, vomiting, diarrhea, change in bowel habits, loss of appetite, bloody stools.  Resp: cough, productive:brown sputum , hemoptysis, dyspnea, chest pain, pleuritic.  Skin: rash or itching or icterus GU: dysuria, change in color of urine, urgency or frequency. flank pain, hematuria  MS: joint pain or swelling. decreased range of motion  Psych: change in mood or affect. depression or anxiety.  Neuro: difficulty with speech, weakness, numbness, ataxia    SUBJECTIVE:   VITAL SIGNS: Temp:  [98.1 F (36.7 C)-99.2 F (37.3 C)] 99 F (37.2 C) (11/07 1940) Pulse Rate:  [65-94] 70 (11/07 1954) Resp:  [9-19] 13 (11/07 1954) BP: (123-183)/(46-108) 162/63 (11/07 1954) SpO2:  [88 %-99 %] 95 % (11/07 1954) FiO2 (%):  [28 %] 28 % (11/07 1954)  PHYSICAL EXAMINATION: General:  Male of normal body habitus in NAD Neuro:  Alert, oriented, non-focal HEENT:  Tracheostomy in place. Bilateral face appears to have skin grafts Cardiovascular:  RRR, no MRG Lungs:  Transmitted upper airway sounds, diminished  bases Abdomen:  Soft, non-tender, non-distended Musculoskeletal:  No acute deformity Skin:  Grossly intact    Recent Labs Lab 02/01/16 0338 02/02/16 0332 02/03/16 0314  NA 137 135 135  K 4.0 4.2 4.0  CL 99* 100* 101  CO2 30 28 27   BUN 25* 26* 26*  CREATININE 1.76* 1.68* 1.42*  GLUCOSE 118* 111* 115*    Recent Labs Lab 01/28/16 1215 01/29/16 0310 02/01/16 0338  HGB 9.7* 9.7* 10.3*  HCT 30.0* 29.7* 31.5*  WBC 3.3* 6.5 5.7  PLT 153 178 158   Dg Knee 1-2 Views Right  Result Date: 02/03/2016 CLINICAL DATA:  75 year old male with right knee pain and swelling for the past 2 days. No injury. Initial encounter. EXAM: RIGHT KNEE - 1-2 VIEW COMPARISON:  None. FINDINGS: Mild tricompartment degenerative changes. CPPD. Small suprapatellar joint effusion. No fracture or dislocation. IMPRESSION: Mild tricompartment degenerative changes. CPPD. Small suprapatellar joint effusion. Electronically Signed   By: Genia Del M.D.   On: 02/03/2016 15:53   Dg Chest Port 1 View  Result Date: 02/03/2016 CLINICAL DATA:  75 y/o  M; cough. EXAM: PORTABLE CHEST 1 VIEW COMPARISON:  01/28/2016 and 01/17/2016 chest radiographs 01/28/2016 CT chest. FINDINGS: Patchy left basilar opacity likely corresponding  to infiltrate on prior CT is more conspicuous than the prior radiograph. This may be accentuated by patient's rotation. Essentially resolved right upper lobe pneumonia. No pleural effusion or pneumothorax. Tracheostomy noted. No acute osseous abnormality identified. IMPRESSION: Patchy left basilar opacity likely corresponding to infiltrate on prior CT is more conspicuous than the prior radiograph. This may be accentuated by patient's rotation. Electronically Signed   By: Kristine Garbe M.D.   On: 02/03/2016 16:48    ASSESSMENT / PLAN:  HCAP: clinically he feel better with the exception of increased secretions. Suspect he is clearing secretions related to his resolving pneumonia.    Recommendations - OK to continue Levaquin for 2 weeks total therapy - PCT algorithm, if negative can dc ABX sooner. - Scheduled Duonebs while inpatient - Follow tracheal aspirate culture - Chest PT while in hospital via chest vest - OK for discharge tomorrow from pulmonary standpoint with PCP follow-up in 2-5 days.  Pulmonary nodules  - Follow up non-contrast CT chest in 3 to 6 months.  Georgann Housekeeper, AGACNP-BC [facility] Pager [phone] or [phone]  02/03/2016 8:55 PM    ATTENDING NOTE / ATTESTATION NOTE :   I have discussed the case with the resident/APP  Georgann Housekeeper.   I agree with the resident/APP's  history, physical examination, assessment, and plans.    I have edited the above note and modified it according to our agreed history, physical examination, assessment and plan.   Briefly, patient admitted for dyspnea and tachypnea and for pneumonia. Patient is status post laryngectomy, radiation, chemotherapy for laryngeal cancer 7-8 years ago. He ended up getting a tracheostomy and a feeding tube during that time. According to the patient, he has not had any infection or any trach issues for many years now. He was started on broad-spectrum antibiotics with vancomycin and Zosyn. Chest x-ray on admission showed infiltrate over the right upper lobe which is actually improved compared to x-ray done in October. Chest CT scan shows biapical bronchiectasis, patchy left lower lobe infiltrate, right upper lobe nodular/groundglass like infiltrates. Patient was switched to levofloxacin. The last 3-4 days, note of more yellow-green secretions per trach. Patient however feels improved overall except for more secretions.  Patient seen, examined. Chronically ill gentleman. Comfortable. 160/60, HR 70s. T 39F. 92% on 28% Fio2. Dental caries. Positive tracheostomy in place, #6 cuffless Shiley  with Passy-Muir valve with brownish secretions. Chest exam revealed fair air  entry. Rhonchi at the bases. Occasional crackles at the bases. No wheezing. Cardiac exam showed good S1 and S2. No S3 murmur rub or gallop. Abdominal exam showed possible bowel sounds, soft, flat. Positive PEG tube extremities revealed negative edema clubbing cyanosis. Skin was warm and dry. No rash. Rest of exam per Paul's.   Labs reviewed. MRSA screen (+) on admission. Chest x-ray done today with report of  left lower lobe infiltrate. On reviewing chest x-ray, I could not necessarily see a left lower lobe infiltrate. WBC 6. Creatinine 1.42.   Assessment: 1. Pneumonia, left lower lobe, right upper lobe. Right upper lobe pneumonia looked  better on the chest x-ray on admission compared to October 2017. MRSA nares (+).  2. Increased tracheal secretions. Tracheitis vs new PNA. Favor tracheitis.  3. S/P tracheostomy for laryngeal cancer. Status post laryngotomy, radiation, chemotherapy for laryngeal CA, 8 yrs ago.  4. Mild hypoxemia 5. AKI  Plan: 1. Patient wants to go home. It's the first time I'm seeing him but he mentions he is clinically improved except  for increased secretions which might just be tracheitis. Sputum sample has been sent tonight. He is currently on levofloxacin. He was on vancomycin and Zosyn for 3 days. I mentioned to him, ideally, we need to determine what the organism is before we send him home. If he really wants to go home, I suggest follow-up in 1-2 days so we can tailor antibiotics depending on cultures. As he is clinically improved, I will continue levofloxacin and plan to complete 7 days total pending culture.  2. Currently he is on 28% FiO2. Suggest to taper off to room air as long as O2 saturation > 88-90%.  3. Start duoneb qid.  4. 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 5. Check pro calcitonin to help with decision regarding  antibiotics.   Family :No family at bedside.    Monica Becton, MD 02/03/2016, 10:00 PM [facility] Pager [phone] After 3 pm or if no answer, call [phone]

## 2016-02-03 NOTE — Evaluation (Addendum)
Physical Therapy Evaluation Patient Details Name: Cristian Harding MRN: [id number] DOB: 1941/03/28 Today's Date: 02/03/2016   History of Present Illness  75 y.o. male admitted with hypoxia, Dx of HCAP. PMH of throat cancer, PEG, tracheostomy  Clinical Impression  Pt admitted with above diagnosis. Pt currently with functional limitations due to the deficits listed below (see PT Problem List). Pt ambulated 30' with RW, SaO2 88-92% on RA, distance limited by 8/10 R knee and ankle pain. Family would like further work up of RLE pain, they state onset of pain was during this hospitalization.  Pt will benefit from skilled PT to increase their independence and safety with mobility to allow discharge to the venue listed below.       Follow Up Recommendations Home health PT (pt refuses SNF)    Equipment Recommendations  Rolling walker with 5" wheels    Recommendations for Other Services       Precautions / Restrictions Precautions Precautions: Fall Restrictions Weight Bearing Restrictions: No      Mobility  Bed Mobility Overal bed mobility: Needs Assistance Bed Mobility: Supine to Sit     Supine to sit: Mod assist;+2 for safety/equipment     General bed mobility comments: mod A to raise trunk and advance BLEs  Transfers Overall transfer level: Needs assistance Equipment used: Rolling walker (2 wheeled) Transfers: Sit to/from Stand Sit to Stand: +2 safety/equipment;Mod assist         General transfer comment: mod A to rise/steady  Ambulation/Gait Ambulation/Gait assistance: +2 safety/equipment;Min guard Ambulation Distance (Feet): 30 Feet Assistive device: Rolling walker (2 wheeled) Gait Pattern/deviations: Step-through pattern;Decreased step length - right;Decreased step length - left   Gait velocity interpretation: Below normal speed for age/gender General Gait Details: SaO2 88-92% on RA walking, no LOB, distance limited by R knee/ankle pain, forward head  Stairs            Wheelchair Mobility    Modified Rankin (Stroke Patients Only)       Balance Overall balance assessment: Needs assistance   Sitting balance-Leahy Scale: Fair       Standing balance-Leahy Scale: Poor                               Pertinent Vitals/Pain Pain Assessment: 0-10 Pain Score: 8  Pain Location: R knee and ankle with weightbearing Pain Descriptors / Indicators: Aching Pain Intervention(s): Monitored during session;Limited activity within patient's tolerance;Ice applied;Premedicated before session    Home Living Family/patient expects to be discharged to:: Private residence Living Arrangements: Spouse/significant other Available Help at Discharge: Available 24 hours/day Type of Home: House Home Access: Stairs to enter   Technical brewer of Steps: 1 Home Layout: One level Home Equipment: None      Prior Function Level of Independence: Independent         Comments: drives     Hand Dominance        Extremity/Trunk Assessment   Upper Extremity Assessment: Generalized weakness           Lower Extremity Assessment: Generalized weakness;RLE deficits/detail RLE Deficits / Details: knee flexion painful past 50*, pain R knee and ankle with weightbearing, negative Homan's sign, sensation intact to light touch    Cervical / Trunk Assessment: Other exceptions  Communication   Communication: Passy-Muir valve  Cognition Arousal/Alertness: Awake/alert Behavior During Therapy: WFL for tasks assessed/performed Overall Cognitive Status: Within Functional Limits for tasks assessed  General Comments      Exercises     Assessment/Plan    PT Assessment Patient needs continued PT services  PT Problem List Decreased activity tolerance;Decreased range of motion;Decreased mobility;Pain;Decreased knowledge of use of DME;Decreased balance          PT Treatment Interventions DME instruction;Gait  training;Functional mobility training;Stair training;Therapeutic exercise;Therapeutic activities;Patient/family education    PT Goals (Current goals can be found in the Care Plan section)  Acute Rehab PT Goals Patient Stated Goal: to go home, find out what's causing knee pain PT Goal Formulation: With patient/family Time For Goal Achievement: 02/17/16 Potential to Achieve Goals: Good    Frequency Min 3X/week   Barriers to discharge        Co-evaluation               End of Session Equipment Utilized During Treatment: Gait belt Activity Tolerance: Patient limited by pain Patient left: in bed;with call bell/phone within reach;with family/visitor present Nurse Communication: Mobility status         Time: 1200-1231 PT Time Calculation (min) (ACUTE ONLY): 31 min   Charges:   PT Evaluation $PT Eval Moderate Complexity: 1 Procedure PT Treatments $Gait Training: 8-22 mins   PT G Codes:        [name] 02/03/2016, 1:39 PM [phone]

## 2016-02-03 NOTE — Progress Notes (Signed)
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 He has been doing more frequent suctioning recently and has been having worsening cough and hoarseness. No chest pain, fever or chills reported. He was treated recently for a pneumonia from (last month). In the ER, his sat was low to 70s and he had two episodes of vomiting and difficulty passing medications through the PEG tube. There is a suspicion that he has gastric outlet obstruction. In one episode , bloody fluid was aspirated but no coffee ground or clots. No abdominal pain or diarrhea. Last BM was yesterday. He gets nothing orally, all intake is via PEG tube and last meal was this morning.    Subjective   Patient Was supposed to discharge today, when he started having brown colored secretions from the trach. Chest x-ray was done which showed possible infiltrate. [facility] was consulted   Assessment/Plan:     1. Healthcare associated pneumonia- patchy infiltrate in the left lower lobe, question aspiration as per CT chest. Patient was started on  vancomycin and Zosyn.Continue on IV Levaquin. Robitussin 400 mg every 4 hours. Suction when necessary 2. ? Hemoptysis- consulted [facility] M, chest x-ray showed possible pneumonia. Continue Levaquin. Dr Lake Bells to see the patient. 3. Acute kidney injury- unclear cause at this time, today creatinine is 1.68, it was 0.86 on 01/29/16. Slight improvement with IV fluids. Likely dehydration, renal ultrasound showed no hydronephrosis. Follow BMP in a.m. 4. Vomiting- resolved, patient had large gastric residual, consulted IR for PEG tube malfunction, PEG tube functioning fine. Also checked abdominal x-ray which showed nonobstructive bowel gas  pattern. 5. Urinary retention- patient developed urinary retention in the hospital had large residual urine on bladder scan, Foley catheter has been placed. I called and discussed with urologist Dr. Jeffie Pollock, who recommends to discharge patient home on Foley catheter and follow-up with urology in 2 weeks. Renal ultrasound has been negative for hydronephrosis.  Tamsulosin 0.4 mg daily. 6. Right knee pain - x-ray of right knee showed CPPD, degenerative changes, mild suprapatellar effusion. Continue pain management 7. Nutrition- patient's wife says  that she has been giving 2 cans of Jevity twice a day, nutrition was consulted and Jevity rate increased to 50 mL per hour in the hospital.Home regimen recommendation: 1 can Jevity 1.5 every 3 hours (5 cans/day). Flush with 100 mL free water before and 100 mL free water after each TF bolus. Nutrition to follow for recommendations. 8. Hypertension - blood pressure is elevated, started on metoprolol 25 milligrams twice a day. Added amlodipine 5 mg daily. Hydralazine 25 mg every 6 hours when necessary 9. Pulmonary nodule- CT chest shows pulmonary noduleSemi-solid nodular opacity posterior segment right upper        lobe measuring 1.0 x 0.6 cm. noncontrast CT in 3-6 months is recommended to check pulmonary nodules. 10.    Aortic aneurysm- Ascending thoracic aorta measures 4.4 x 4.4 cm. No dissection, and will CTA or MRA recommended for screening.      DVT prophylaxis: Lovenox  Code Status: Full code  Family Communication: Discussed with patient's daughter and wife at bedside  on 01/29/2016  Disposition Plan: home when OK with PCCM   Consultants:  None  Procedures:  None   Antibiotics:   Anti-infectives    Start     Dose/Rate Route Frequency Ordered Stop   02/03/16 0000  levofloxacin (LEVAQUIN) 750 MG tablet     750 mg Oral Every 48 hours 02/03/16 1057 02/08/16 2359   02/02/16 1000  levofloxacin (LEVAQUIN) IVPB 750 mg     750 mg 100 mL/hr over  90 Minutes Intravenous Every 48 hours 02/01/16 0914     01/31/16 1000  levofloxacin (LEVAQUIN) IVPB 750 mg  Status:  Discontinued     750 mg 100 mL/hr over 90 Minutes Intravenous Every 24 hours 01/31/16 0830 02/01/16 0914   01/29/16 0800  vancomycin (VANCOCIN) IVPB 1000 mg/200 mL premix  Status:  Discontinued     1,000 mg 200 mL/hr over 60 Minutes Intravenous Every 12 hours 01/28/16 1949 01/31/16 0752   01/28/16 2000  piperacillin-tazobactam (ZOSYN) IVPB 3.375 g  Status:  Discontinued     3.375 g 12.5 mL/hr over 240 Minutes Intravenous Every 8 hours 01/28/16 1949 01/31/16 0752   01/28/16 2000  vancomycin (VANCOCIN) 1,500 mg in sodium chloride 0.9 % 500 mL IVPB     1,500 mg 250 mL/hr over 120 Minutes Intravenous  Once 01/28/16 1949 01/28/16 2224       Objective   Vitals:   02/03/16 1200 02/03/16 1224 02/03/16 1600 02/03/16 1624  BP: (!) 166/71  (!) 174/76   Pulse: 86  89 91  Resp: (!) 9  15 16   Temp: 99.2 F (37.3 C)  98.1 F (36.7 C)   TempSrc: Oral  Oral   SpO2: 92% (!) 88% 92% 91%  Weight:      Height:        Intake/Output Summary (Last 24 hours) at 02/03/16 1836 Last data filed at 02/03/16 1600  Gross per 24 hour  Intake             1430 ml  Output              450 ml  Net              980 ml   Filed Weights   01/28/16 1046  Weight: 71.7 kg (158 lb)     Physical Examination:  General exam: Appears calm and comfortable. Respiratory system: Trach collar in place, Clear to auscultation. Respiratory effort normal. Cardiovascular system:  RRR. No  murmurs, rubs, gallops. No pedal edema. GI system: Abdomen is nondistended, soft and nontender. No organomegaly. Peg tube in place. Central nervous system. No focal neurological deficits. 5 x 5 power in all extremities. Skin: No rashes, lesions or ulcers. Psychiatry: Alert, oriented x 3.Judgement and insight appear normal. Affect normal.    Data Reviewed: I have personally reviewed following labs and imaging  studies  CBG:  Recent Labs Lab 02/03/16 0801  GLUCAP 133*    CBC:  Recent Labs Lab 01/28/16 1215 01/29/16 0310 02/01/16 0338  WBC 3.3* 6.5 5.7  NEUTROABS 2.3  --   --   HGB 9.7* 9.7* 10.3*  HCT 30.0* 29.7* 31.5*  MCV 97.4 94.9 94.3  PLT 153 178 0000000    Basic Metabolic Panel:  Recent Labs Lab 01/28/16 1215 01/29/16 0310 02/01/16 0338 02/02/16 0332 02/03/16 0314  NA 138 139 137 135 135  K 4.1 4.4 4.0 4.2 4.0  CL 98* 100* 99* 100* 101  CO2 35* 32 30 28 27   GLUCOSE 140* 110* 118* 111* 115*  BUN 21* 18 25* 26* 26*  CREATININE 0.89 0.86 1.76* 1.68*  1.42*  CALCIUM 9.5 9.4 9.2 8.8* 8.6*    Recent Results (from the past 240 hour(s))  MRSA PCR Screening     Status: Abnormal   Collection Time: 01/28/16  8:45 PM  Result Value Ref Range Status   MRSA by PCR POSITIVE (A) NEGATIVE Final    Comment:        The GeneXpert MRSA Assay (FDA approved for NASAL specimens only), is one component of a comprehensive MRSA colonization surveillance program. It is not intended to diagnose MRSA infection nor to guide or monitor treatment for MRSA infections. RESULT CALLED TO, READ BACK BY AND VERIFIED WITH: BEN MADDOX,RN [id number] @ 2237 BY J SCOTTON      Liver Function Tests:  Recent Labs Lab 01/29/16 0310  AST 25  ALT 26  ALKPHOS 125  BILITOT 0.5  PROT 7.0  ALBUMIN 3.5    Cardiac Enzymes:  Recent Labs Lab 01/28/16 1215  TROPONINI <0.03   BNP (last 3 results) No results for input(s): BNP in the last 8760 hours.  ProBNP (last 3 results) No results for input(s): PROBNP in the last 8760 hours.    Studies: Dg Knee 1-2 Views Right  Result Date: 02/03/2016 CLINICAL DATA:  75 year old male with right knee pain and swelling for the past 2 days. No injury. Initial encounter. EXAM: RIGHT KNEE - 1-2 VIEW COMPARISON:  None. FINDINGS: Mild tricompartment degenerative changes. CPPD. Small suprapatellar joint effusion. No fracture or dislocation. IMPRESSION: Mild  tricompartment degenerative changes. CPPD. Small suprapatellar joint effusion. Electronically Signed   By: Genia Del M.D.   On: 02/03/2016 15:53   Dg Chest Port 1 View  Result Date: 02/03/2016 CLINICAL DATA:  75 y/o  M; cough. EXAM: PORTABLE CHEST 1 VIEW COMPARISON:  01/28/2016 and 01/17/2016 chest radiographs 01/28/2016 CT chest. FINDINGS: Patchy left basilar opacity likely corresponding to infiltrate on prior CT is more conspicuous than the prior radiograph. This may be accentuated by patient's rotation. Essentially resolved right upper lobe pneumonia. No pleural effusion or pneumothorax. Tracheostomy noted. No acute osseous abnormality identified. IMPRESSION: Patchy left basilar opacity likely corresponding to infiltrate on prior CT is more conspicuous than the prior radiograph. This may be accentuated by patient's rotation. Electronically Signed   By: Kristine Garbe M.D.   On: 02/03/2016 16:48    Scheduled Meds: . amLODipine  5 mg Oral Daily  . enoxaparin (LOVENOX) injection  40 mg Subcutaneous Q24H  . feeding supplement (OSMOLITE 1.5 CAL)  1,000 mL Per Tube Q24H  . fentaNYL  75 mcg Transdermal Q72H  . free water  50 mL Per Tube QID  . glycopyrrolate  1 mg Oral BID  . guaiFENesin  20 mL Oral Q4H  . levofloxacin (LEVAQUIN) IV  750 mg Intravenous Q48H  . levothyroxine  50 mcg Intravenous QAC breakfast  . metoCLOPramide  10 mg Oral TID AC  . metoprolol tartrate  25 mg Oral BID  . mupirocin ointment  1 application Nasal BID  . pantoprazole sodium  40 mg Per Tube Daily  . tamsulosin  0.4 mg Oral QPC breakfast  . traZODone  100 mg Per J Tube QHS    Continuous Infusions: . sodium chloride 10 mL/hr at 02/03/16 0750    Time spent: 25 min  [facility] Pager [phone]. If 7PM-7AM, please contact night-coverage at [email or web address], Office  [phone]  password [email or web address] 02/03/2016, 6:36 PM  LOS: 6 days

## 2016-02-03 NOTE — Discharge Summary (Addendum)
Physician Discharge Summary  Cristian Harding [id number] DOB: [birth year] DOA: 01/28/2016  PCP: Charleston Poot, MD  Admit date: 01/28/2016 Discharge date: 02/04/2016  Time spent: 25 minutes  Recommendations for Outpatient Follow-up:  1. Will need BMP in one week to check renal function 2. HHRN to draw blood in one week and send to PCP on 02/09/15 3. Follow Up with PCP in 2-5 days for recheck and follow up on sputum culture results 4. Follow up Urology in one week 5. Noncontrast CT in 3 months is recommended to check pulmonary nodules 6. Ascending thoracic aorta measures 4.4 x 4.4 cm. No dissection, recommend annual CTA or MRA recommended for screening.    Discharge Diagnoses:  Active Problems:   Hypoxia   Nausea with vomiting   HCAP (healthcare-associated pneumonia)   PEG tube malfunction (HCC)   Urinary retention   Acute kidney injury Cristian Harding)   Discharge Condition: Stable  Diet recommendation:  1. 1 can Jevity 1.5 every 3 hours (5 cans/day). Flush with 100 mL free water before and 100 mL free water after each TF bolus. Nutrition to follow for recommendations. 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 He has been doing more frequent suctioning recently and has been having worsening cough and hoarseness. No chest pain, fever or chills reported. He was treated recently for a pneumonia from (last month). In the ER, his sat was low to 70s and he had two episodes of vomiting and difficulty passing medications through the PEG tube. There is a suspicion that he has gastric outlet obstruction. In one episode , bloody fluid was aspirated but no coffee ground or clots. No abdominal pain or diarrhea. Last BM was yesterday. He gets nothing orally, all intake is via  PEG tube and last meal was this morning.  Harding Course:  1. Healthcare associated pneumonia- patchy infiltrate in the left lower lobe, question aspiration as per CT chest. Patient was started on  vancomycin and Zosyn.Continued on IV Levaquin. Robitussin 400 mg every 4 hours. Suction when necessary. Will discharge home on Po Levaquin for 5 more days. 2. Acute kidney injury- unclear cause at this time, baseline creatinine is 0.86,  today creatinine is 1.42, it was 1.68 yesterday. Slight improvement with IV fluids. Likely dehydration, renal ultrasound showed no hydronephrosis. Will need repeat BMP in one week on  3. Vomiting- resolved, patient had large gastric residual, consulted IR for PEG tube malfunction, PEG tube functioning fine. Also checked abdominal x-ray which showed nonobstructive bowel gas pattern. 4. Urinary retention- patient developed urinary retention in the Harding had large residual urine on bladder scan, Foley catheter has been placed. I called and discussed with urologist Dr. Jeffie Pollock, who recommends to discharge patient home on Foley catheter and follow-up with urology in 1 week. Renal ultrasound showed no hydronephrosis.Burnis Medin start tamsulosin 0.4 mg daily. 5. Nutrition- patient's wife says  that she has been giving 2 cans of Jevity twice a day, nutrition was consulted and Jevity rate increased to 50 mL per hour in the Harding.Home regimen recommendation: 1 can Jevity 1.5 every 3 hours (5 cans/day). Flush with 100 mL free water before and 100 mL free water after each TF bolus. Nutrition to follow for recommendations. 6.  Hypertension - blood pressure is elevated, started on metoprolol 25 mg twice a day. Will also add amlodipine 5 mg daily.  7. Pulmonary nodule- CT chest shows pulmonary noduleSemi-solid nodular opacity posterior segment right upper        lobe measuring 1.0 x 0.6 cm. noncontrast CT in 3-6 months is recommended to check pulmonary nodules           8.    Aortic aneurysm-  Ascending thoracic aorta measures 4.4 x 4.4 cm. No dissection, recommend annual   CTA or MRA recommended for screening.  Procedures:  None   Consultations:  None   Discharge Exam: Vitals:   02/03/16 0900 02/03/16 1000  BP: (!) 155/66 (!) 175/78  Pulse: 79 94  Resp: 17 15  Temp:      General: Appear in no acute distress Cardiovascular: RRR, no murmur Respiratory: clear bilaterally  Discharge Instructions   Discharge Instructions    Diet - low sodium heart healthy    Complete by:  As directed    Discharge instructions    Complete by:  As directed    1 can Jevity 1.5 every 3 hours (5 cans/day). Flush with 100 mL free water before and 100 mL free water after each TF bolus. Nutrition to follow for recommendations.   Increase activity slowly    Complete by:  As directed      Current Discharge Medication List    START taking these medications   Details  amLODipine (NORVASC) 5 MG tablet Take 1 tablet (5 mg total) by mouth daily. Qty: 30 tablet, Refills: 2    levofloxacin (LEVAQUIN) 750 MG tablet Take 1 tablet (750 mg total) by mouth every other day. Qty: 3 tablet, Refills: 0    metoprolol tartrate (LOPRESSOR) 25 MG tablet Take 1 tablet (25 mg total) by mouth 2 (two) times daily. Qty: 60 tablet, Refills: 2    tamsulosin (FLOMAX) 0.4 MG CAPS capsule Take 1 capsule (0.4 mg total) by mouth daily after breakfast. Qty: 30 capsule, Refills: 2      CONTINUE these medications which have CHANGED   Details  Nutritional Supplements (FEEDING SUPPLEMENT, JEVITY 1.5 CAL,) LIQD 1 can Jevity 1.5 every 3 hours (5 cans/day). Flush with 100 mL free water before and 100 mL free water after each TF bolus. Nutrition to follow for recommendations. Refills: 0      CONTINUE these medications which have NOT CHANGED   Details  acetaminophen (TYLENOL) 160 MG/5ML liquid Place 960 mg into feeding tube every 4 (four) hours as needed for fever.     diazepam (VALIUM) 10 MG tablet Place 10 mg  into feeding tube 3 (three) times daily as needed for anxiety.     dicyclomine (BENTYL) 20 MG tablet Place 20 mg into feeding tube 3 (three) times daily.    fentaNYL (DURAGESIC - DOSED MCG/HR) 75 MCG/HR Place 75 mcg onto the skin every 3 (three) days.    glycopyrrolate (ROBINUL) 1 MG tablet Place 1 mg into feeding tube 2 (two) times daily.     guaifenesin (ROBITUSSIN) 100 MG/5ML syrup Place 300 mg into feeding tube 3 (three) times daily as needed (thinning mucus.).     levothyroxine (SYNTHROID, LEVOTHROID) 75 MCG tablet Place 75 mcg into feeding tube daily before breakfast.     metoCLOPramide (REGLAN) 5 MG/5ML solution Place 10 mg into feeding tube 3 (three) times daily before meals.     morphine 10 MG/5ML solution Place 10-20 mg into feeding tube every 4 (four) hours  as needed for severe pain (breakthrough pain.).     promethazine (PHENERGAN) 25 MG tablet Place 25 mg into feeding tube every 8 (eight) hours as needed for nausea or vomiting.     traZODone (DESYREL) 50 MG tablet Place 100 mg into feeding tube at bedtime.        No Known Allergies Follow-up Information    RUEHLE, STEPHEN, MD Follow up in 2 week(s).   Specialty:  Internal Medicine Contact information: [address], [address] [place] [address] [phone]        Malka So, MD. Schedule an appointment as soon as possible for a visit in 1 week(s).   Specialty:  Urology Contact information: [address] [place] [address] [phone]            The results of significant diagnostics from this hospitalization (including imaging, microbiology, ancillary and laboratory) are listed below for reference.    Significant Diagnostic Studies: Dg Chest 2 View  Result Date: 01/28/2016 CLINICAL DATA:  Pneumonia 2 weeks ago EXAM: CHEST  2 VIEW COMPARISON:  01/17/2016 FINDINGS: Patchy airspace disease in the right upper lobe has nearly resolved. There is minimal ill-defined opacities remaining. Lungs  otherwise clear. Low volumes. Normal heart size. No pneumothorax or pleural effusion. IMPRESSION: Nearly resolved right upper lobe pneumonia. This supports benign etiology. Electronically Signed   By: Marybelle Killings M.D.   On: 01/28/2016 11:57   Dg Chest 2 View  Result Date: 01/17/2016 CLINICAL DATA:  Treated for pneumonia 2 weeks ago, symptoms return last few days. EXAM: CHEST  2 VIEW COMPARISON:  None. FINDINGS: Cardiomediastinal silhouette is unremarkable. There is patchy streaky infiltrate in right upper lobe. Findings suspicious for recurrent pneumonia. Follow-up to resolution is recommended. Degenerative changes thoracic spine. IMPRESSION: There is patchy streaky infiltrate in right upper lobe. Findings suspicious for recurrent pneumonia. Follow-up to resolution is recommended. Electronically Signed   By: Lahoma Crocker M.D.   On: 01/17/2016 12:26   Ct Chest W Contrast  Result Date: 01/28/2016 CLINICAL DATA:  Reflux with concern for aspiration. Prior tracheostomy. EXAM: CT CHEST WITH CONTRAST TECHNIQUE: Multidetector CT imaging of the chest was performed during intravenous contrast administration. CONTRAST:  73mL ISOVUE-300 IOPAMIDOL (ISOVUE-300) INJECTION 61% COMPARISON:  Chest radiograph January 28, 2016 FINDINGS: Cardiovascular: There is prominence of the ascending thoracic aorta with a measured transverse diameter of 4.4 x 4.4 cm. No thoracic aortic dissection is seen. There are scattered foci of calcification in the aorta. There is mild calcification in the proximal great vessels as well. There are foci of coronary artery calcification evident. The pericardium is not appreciably thickened. No major vessel pulmonary embolus is demonstrated. Mediastinum/Nodes: Tracheostomy catheter is positioned with the tip in the mid trachea. Thyroid is diminutive without mass evident. There are scattered subcentimeter mediastinal lymph nodes. There is a focal lymph node anterior to the carina on the right measuring  1.4 x 1.2 cm. There is a sub- carinal lymph node measuring 2.0 x 1.7 cm. No other lymph node prominence is evident in the thoracic region. Lungs/Pleura: There is an azygos lobe on the right, an anatomic variant. There is scarring in the apices with localized bronchiolectasis in the apices medially. There is a semi-solid nodular opacity in the posterior segment of the right upper lobe measuring 1.0 x 0.6 cm, seen on axial slice 52 series 4. There is patchy ground-glass type opacity in the anterior segment of the right upper lobe, measuring 1.2 x 0.7 cm, best  appreciated on axial slice 30 series 4. There is mild patchy infiltrate in the superior and medial segments of the left lower lobe. There is mild upper and lower lobe bronchiectatic change bilaterally. Upper Abdomen: In the visualized upper abdomen, there is a gastrostomy catheter extending into the stomach, incompletely visualized. There are low-attenuation adrenal masses bilaterally. The mass on the right measures 2.5 x 2.2 cm. The mass on the left measures 1.9 x 1.7 cm. Visualized upper abdominal structures otherwise appear unremarkable. 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 There are no blastic or lytic bone lesions. IMPRESSION: Patchy infiltrate left lower lobe. Question aspiration given the clinical history. Ground-glass opacity anterior segment right upper lobe measuring 1.2 x 0.7 cm. Semi-solid nodular opacity posterior segment right upper lobe measuring 1.0 x 0.6 cm. Non-contrast chest CT at 3-6 months is recommended. If nodules persist, subsequent management will be based upon the most suspicious nodule(s). This recommendation follows the consensus statement: Guidelines for Management of Incidental Pulmonary Nodules Detected on CT Images: From the Fleischner Society 2017; Radiology 2017; 284:228-243. Mildly prominent lymph nodes in the precarinal and sub- carinal regions. Question reactive etiology.  Scarring with bronchiolectasis in both lung apices. There is bronchiectasis in the upper and lower lobe regions as well. Ascending thoracic aorta measures 4.4 x 4.4 cm. No dissection. Recommend annual imaging followup by CTA or MRA. This recommendation follows 2010 ACCF/AHA/AATS/ACR/ASA/SCA/SCAI/SIR/STS/SVM Guidelines for the Diagnosis and Management of Patients with Thoracic Aortic Disease. Circulation. 2010; 121SP:1689793. There are foci of calcification in several carotid arteries. Electronically Signed   By: Lowella Grip III M.D.   On: 01/28/2016 15:34   US Renal  Result Date: 02/01/2016 CLINICAL DATA:  Urinary retention.  Acute kidney injury. EXAM: RENAL / URINARY TRACT ULTRASOUND COMPLETE COMPARISON:  None. FINDINGS: Right Kidney: Length: 11.8 cm. Mildly increased renal parenchymal echogenicity. No mass or hydronephrosis visualized. Left Kidney: Length: 11.0 cm. Mildly increased renal parenchymal echogenicity. No mass or hydronephrosis visualized. Bladder: Nearly completely empty with Foley catheter seen in place. IMPRESSION: Normal size kidneys with increased parenchymal echogenicity, consistent with medical renal disease. No evidence of hydronephrosis. Nearly empty bladder, with Foley catheter in place. Electronically Signed   By: Earle Gell M.D.   On: 02/01/2016 15:18   Dg Abd 2 Views  Result Date: 01/29/2016 CLINICAL DATA:  Status post PEG tube placement. Dyspnea. Symptoms for the last few days. Requiring more frequent suctioning. Cough and hoarseness. Recent pneumonia. Question of gastric outlet obstruction. EXAM: ABDOMEN - 2 VIEW COMPARISON:  None. FINDINGS: No free intraperitoneal air on decubitus view of the abdomen. Gastrostomy tube overlies the left upper quadrant. Bowel gas pattern is nonobstructive. Contrast is identified within the urinary bladder following intravenous contrast given for CT of the chest yesterday. There is a wedge compression fracture of L1. IMPRESSION:  Nonobstructed bowel-gas pattern. Electronically Signed   By: Nolon Nations M.D.   On: 01/29/2016 14:25    Microbiology: Recent Results (from the past 240 hour(s))  MRSA PCR Screening     Status: Abnormal   Collection Time: 01/28/16  8:45 PM  Result Value Ref Range Status   MRSA by PCR POSITIVE (A) NEGATIVE Final    Comment:        The GeneXpert MRSA Assay (FDA approved for NASAL specimens only), is one component of a comprehensive MRSA colonization surveillance program. It is not intended to diagnose MRSA infection nor to guide or monitor treatment for MRSA infections. RESULT CALLED TO, READ  BACK BY AND VERIFIED WITH: BEN MADDOX,RN [id number] @ [address]: Basic Metabolic Panel:  Recent Labs Lab 01/28/16 1215 01/29/16 0310 02/01/16 0338 02/02/16 0332 02/03/16 0314  NA 138 139 137 135 135  K 4.1 4.4 4.0 4.2 4.0  CL 98* 100* 99* 100* 101  CO2 35* 32 30 28 27   GLUCOSE 140* 110* 118* 111* 115*  BUN 21* 18 25* 26* 26*  CREATININE 0.89 0.86 1.76* 1.68* 1.42*  CALCIUM 9.5 9.4 9.2 8.8* 8.6*   Liver Function Tests:  Recent Labs Lab 01/29/16 0310  AST 25  ALT 26  ALKPHOS 125  BILITOT 0.5  PROT 7.0  ALBUMIN 3.5   No results for input(s): LIPASE, AMYLASE in the last 168 hours. No results for input(s): AMMONIA in the last 168 hours. CBC:  Recent Labs Lab 01/28/16 1215 01/29/16 0310 02/01/16 0338  WBC 3.3* 6.5 5.7  NEUTROABS 2.3  --   --   HGB 9.7* 9.7* 10.3*  HCT 30.0* 29.7* 31.5*  MCV 97.4 94.9 94.3  PLT 153 178 158   Cardiac Enzymes:  Recent Labs Lab 01/28/16 1215  TROPONINI <0.03    CBG:  Recent Labs Lab 02/03/16 0801  GLUCAP 133*     Signed:  LAMA,GAGAN S MD.  [facility] 02/03/2016, 11:00 AM

## 2016-02-03 NOTE — Progress Notes (Signed)
eLink Physician-Brief Progress Note Patient Name: Buz Dorgan DOB: July 13, 1940 MRN: [id number]   Date of Service  02/03/2016  HPI/Events of Note  Consult request for chronic trach, purulent resp secretions and RLL infiltrate  eICU Interventions  Resp culture ordered PCT algorithm ordered PCCM to see in consultation     Intervention Category Intermediate Interventions: Infection - evaluation and management  Wilhelmina Mcardle 02/03/2016, 7:51 PM

## 2016-02-03 NOTE — H&P (Deleted)
Name: Cristian Harding MRN: [id number] DOB: Aug 11, 1940    ADMISSION DATE:  01/28/2016 CONSULTATION DATE:  11/7  REFERRING MD :  Dr. 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 He was admitted 01/28/2016 for healthcare associated pneumonia. He was initially treated with vancomycin and Zosyn which is since been transitioned to levofloxacin. There was concern for gastric outlet obstruction and aspiration, however, this was not worked up as an inpatient. 02/03/2016 he was slated for discharge, however, he had increased sputum production. Chest x-ray was concerning for left lower lobe opacification. PCCM consulted for further evaluation.  SIGNIFICANT EVENTS  11/1 admit to [facility] for PNA 11/7 increased productive cough, PCCM consult  STUDIES:  CT chest 11/1 > Patchy infiltrate left lower lobe. Question aspiration given the clinical history. Ground-glass opacity anterior segment right upper lobe measuring 1.2 x 0.7 cm. Semi-solid nodular opacity posterior segment right upper lobe measuring 1.0 x 0.6 cm. Non-contrast chest CT at 3-6 months is recommended. If nodules persist, subsequent management will be based upon the most suspicious nodule(s). Mildly prominent lymph nodes in the precarinal and sub- carinal regions. Question reactive etiology. Scarring with bronchiolectasis in both lung apices. There is bronchiectasis in the upper and lower lobe regions as well. Ascending thoracic aorta measures 4.4 x 4.4 cm. No dissection.   PAST MEDICAL HISTORY :   has a past medical history of Cancer ([place]) and Thyroid disease.  has a past surgical history that includes Tracheostomy and Gastrostomy w/ feeding tube. Prior to Admission medications   Medication Sig Start Date End Date Taking? Authorizing Provider  acetaminophen (TYLENOL) 160 MG/5ML liquid Place 960 mg into feeding  tube every 4 (four) hours as needed for fever.    Yes Historical Provider, MD  diazepam (VALIUM) 10 MG tablet Place 10 mg into feeding tube 3 (three) times daily as needed for anxiety.    Yes Historical Provider, MD  dicyclomine (BENTYL) 20 MG tablet Place 20 mg into feeding tube 3 (three) times daily.   Yes Historical Provider, MD  fentaNYL (DURAGESIC - DOSED MCG/HR) 75 MCG/HR Place 75 mcg onto the skin every 3 (three) days.   Yes Historical Provider, MD  glycopyrrolate (ROBINUL) 1 MG tablet Place 1 mg into feeding tube 2 (two) times daily.    Yes Historical Provider, MD  guaifenesin (ROBITUSSIN) 100 MG/5ML syrup Place 300 mg into feeding tube 3 (three) times daily as needed (thinning mucus.).    Yes Historical Provider, MD  levothyroxine (SYNTHROID, LEVOTHROID) 75 MCG tablet Place 75 mcg into feeding tube daily before breakfast.    Yes Historical Provider, MD  metoCLOPramide (REGLAN) 5 MG/5ML solution Place 10 mg into feeding tube 3 (three) times daily before meals.    Yes Historical Provider, MD  morphine 10 MG/5ML solution Place 10-20 mg into feeding tube every 4 (four) hours as needed for severe pain (breakthrough pain.).    Yes Historical Provider, MD  promethazine (PHENERGAN) 25 MG tablet Place 25 mg into feeding tube every 8 (eight) hours as needed for nausea or vomiting.    Yes Historical Provider, MD  traZODone (DESYREL) 50 MG tablet Place 100 mg into feeding tube at bedtime.    Yes Historical Provider, MD  amLODipine (NORVASC) 5 MG tablet Take 1 tablet (5 mg total) by mouth daily. 02/04/16   Oswald Hillock, MD  levofloxacin (LEVAQUIN) 750 MG tablet Take  1 tablet (750 mg total) by mouth every other day. 02/03/16 02/08/16  Oswald Hillock, MD  metoprolol tartrate (LOPRESSOR) 25 MG tablet Take 1 tablet (25 mg total) by mouth 2 (two) times daily. 02/03/16   Oswald Hillock, MD  Nutritional Supplements (FEEDING SUPPLEMENT, JEVITY 1.5 CAL,) LIQD 1 can Jevity 1.5 every 3 hours (5 cans/day). Flush with 100 mL  free water before and 100 mL free water after each TF bolus. Nutrition to follow for recommendations. 02/03/16   Oswald Hillock, MD  tamsulosin (FLOMAX) 0.4 MG CAPS capsule Take 1 capsule (0.4 mg total) by mouth daily after breakfast. 02/04/16   Oswald Hillock, MD   No Known Allergies  FAMILY HISTORY:  family history is not on file. SOCIAL HISTORY:  reports that he has never smoked. He has never used smokeless tobacco. He reports that he does not drink alcohol.  REVIEW OF SYSTEMS:   Bolds are positive  Constitutional: weight loss, gain, night sweats, Fevers, chills, fatigue .  HEENT: headaches, Sore throat, sneezing, nasal congestion, post nasal drip, Difficulty swallowing, Tooth/dental problems, visual complaints visual changes, ear ache CV:  chest pain, radiates: ,Orthopnea, PND, swelling in lower extremities, dizziness, palpitations, syncope.  GI  heartburn, indigestion, abdominal pain, nausea, vomiting, diarrhea, change in bowel habits, loss of appetite, bloody stools.  Resp: cough, productive:brown sputum , hemoptysis, dyspnea, chest pain, pleuritic.  Skin: rash or itching or icterus GU: dysuria, change in color of urine, urgency or frequency. flank pain, hematuria  MS: joint pain or swelling. decreased range of motion  Psych: change in mood or affect. depression or anxiety.  Neuro: difficulty with speech, weakness, numbness, ataxia    SUBJECTIVE:   VITAL SIGNS: Temp:  [98.1 F (36.7 C)-99.2 F (37.3 C)] 99 F (37.2 C) (11/07 1940) Pulse Rate:  [65-94] 70 (11/07 1954) Resp:  [9-19] 13 (11/07 1954) BP: (123-183)/(46-108) 162/63 (11/07 1954) SpO2:  [88 %-99 %] 95 % (11/07 1954) FiO2 (%):  [28 %] 28 % (11/07 1954)  PHYSICAL EXAMINATION: General:  Male of normal body habitus in NAD Neuro:  Alert, oriented, non-focal HEENT:  Tracheostomy in place. Bilateral face appears to have skin grafts Cardiovascular:  RRR, no MRG Lungs:  Transmitted upper airway sounds, diminished  bases Abdomen:  Soft, non-tender, non-distended Musculoskeletal:  No acute deformity Skin:  Grossly intact    Recent Labs Lab 02/01/16 0338 02/02/16 0332 02/03/16 0314  NA 137 135 135  K 4.0 4.2 4.0  CL 99* 100* 101  CO2 30 28 27   BUN 25* 26* 26*  CREATININE 1.76* 1.68* 1.42*  GLUCOSE 118* 111* 115*    Recent Labs Lab 01/28/16 1215 01/29/16 0310 02/01/16 0338  HGB 9.7* 9.7* 10.3*  HCT 30.0* 29.7* 31.5*  WBC 3.3* 6.5 5.7  PLT 153 178 158   Dg Knee 1-2 Views Right  Result Date: 02/03/2016 CLINICAL DATA:  75 year old male with right knee pain and swelling for the past 2 days. No injury. Initial encounter. EXAM: RIGHT KNEE - 1-2 VIEW COMPARISON:  None. FINDINGS: Mild tricompartment degenerative changes. CPPD. Small suprapatellar joint effusion. No fracture or dislocation. IMPRESSION: Mild tricompartment degenerative changes. CPPD. Small suprapatellar joint effusion. Electronically Signed   By: Genia Del M.D.   On: 02/03/2016 15:53   Dg Chest Port 1 View  Result Date: 02/03/2016 CLINICAL DATA:  75 y/o  M; cough. EXAM: PORTABLE CHEST 1 VIEW COMPARISON:  01/28/2016 and 01/17/2016 chest radiographs 01/28/2016 CT chest. FINDINGS: Patchy left basilar opacity likely corresponding  to infiltrate on prior CT is more conspicuous than the prior radiograph. This may be accentuated by patient's rotation. Essentially resolved right upper lobe pneumonia. No pleural effusion or pneumothorax. Tracheostomy noted. No acute osseous abnormality identified. IMPRESSION: Patchy left basilar opacity likely corresponding to infiltrate on prior CT is more conspicuous than the prior radiograph. This may be accentuated by patient's rotation. Electronically Signed   By: Kristine Garbe M.D.   On: 02/03/2016 16:48    ASSESSMENT / PLAN:  HCAP: clinically he feel better with the exception of increased secretions. Suspect he is clearing secretions related to his resolving pneumonia.    Recommendations - OK to continue Levaquin for 2 weeks total therapy - PCT algorithm, if negative can dc ABX sooner. - Scheduled Duonebs while inpatient - Follow tracheal aspirate culture - Chest PT while in hospital via chest vest - OK for discharge tomorrow from pulmonary standpoint with PCP follow-up in 2-5 days.  Pulmonary nodules  - Follow up non-contrast CT chest in 3 to 6 months.  Georgann Housekeeper, AGACNP-BC [facility] Pulmonology/Critical Care Pager [phone] or [phone]  02/03/2016 8:52 PM

## 2016-02-03 NOTE — Progress Notes (Signed)
Nutrition Follow-up  DOCUMENTATION CODES:   Not applicable  INTERVENTION:  - Home regimen recommendation: 1 can Jevity 1.5 every 3 hours (5 cans/day). Flush with 100 mL free water before and 100 mL free water after each TF bolus.   - RD will communicate with [place] RD.  NUTRITION DIAGNOSIS:   Swallowing difficulty related to dysphagia as evidenced by per patient/family report. 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 He has been doing more frequent suctioning recently and has been having worsening cough and hoarseness. No chest pain, fever or chills reported. He was treated recently for a pneumonia from (last month). In the ER, his sat was low to 70s and he had two episodes of vomiting and difficulty passing medications through the PEG tube. There is a suspicion that he has gastric outlet obstruction. In one episode , bloody fluid was aspirated but no coffee ground or clots. No abdominal pain or diarrhea. Last BM was yesterday. He gets nothing orally, all intake is via PEG tube and last meal was this morning.   11/7 New consult and documentation that pt to d/c today. Spoke with RN this AM about pt and pt requesting for TF to be turned off overnight. From flow sheet it appears that TF was turned off around 0600 today d/t complaints of nausea and requesting a break from TF. Spoke with Case Manager this AM who states pt with [place] in the home PTA. Attempted to call RD with [facility] but got voicemail both times; attempt again later today. RN this shift reports pt requesting  transition back to bolus TF. Recommend re-starting TF at home this afternoon; recommendations outlined above.  Medications reviewed; 50 mcg IV Synthroid/day, 10 mg Reglan per PEG TID, PRN IV Zofran, 40 mg Protonix/day, PRN Miralax.  Labs reviewed; BUN: 26 mg/dL, creatinine: 1.42 mg/dL, Ca: 8.6 mg/dL, GFR: 47 mL/min.     11/6 - Pt with trach and PEG and order in place for Jevity 1.5 @ 50 mL/hr pt has reached maximum rate of Jevity 1.5 @ 30 mL/hr and reports feelings of fullness/discomfort. - TF was held from [place] yesterday to 0430 this AM and then TF restarted at that time.  - Around 0800 this AM pt requested TF again be turned off d/t feelings of fullness.  - Spoke with pt who reports that he feels he is receiving too much TF per day.  - Explained to pt that at home he was receiving 5 cans/day and current rate (Jevity 1.5 @ 30 mL/hr) is equivalent to 3 cans of TF.  - Pt also reports that he has had ~3x as much stool output as he typically does at home.  - Talked with pt about trial of fiber-free TF formula (Osmolite 1.5) today to assess if signs/symptoms improve.  - Pt states he was on a similar formula shortly after PEG was placed.  - He was switched from that formula to Jevity d/t lack of BMs; bowel habits improved with the switch to a fiber-containing formula.  - Pt would  prefer to remain on Jevity today but is agreeable to re-assessing tomorrow if he is unable to d/c tomorrow (he states he is to d/c tomorrow).  - Spoke with RN shortly after talking with pt. RN had re-started TF and 5-10 minutes later pt complained of feelings of fullness returning.  - Provided RN with information from RD and pt discussion and encouraged RN to alert RD if pt requests TF be turned off again and RD will re-assess at that time.  - No new weight since 01/28/16. - Spoke with RN ~30 minutes ago.  - She reports that after preparing to hang a new bottle of Jevity 1.5, pt reported that he would like to try a  fiber-free TF formula. - Informed RN to initiate Osmolite 1.5 @ 20 mL/hr and increase to 30 mL/hr after 4 hours.   IVF: NS @ 100 mL/hr.    11/2 - Pt with trach and PEG at baseline.  - Pt contributes to the conversation intermittently but wife and daughter provide most of information. - He does not take anything PO and all nutrition and medications are provided via PEG, per wife's report.  - Pt has had PEG x8 years and has been on current TF regimen x7 years: 2 cans Jevity 1.5 BID and 1 can Jevity 1.5 once/day (5 cans/day total).  - This regimen provides 1775 kcal, 76 grams of protein, and 900 mL free water.  - Between flushes for medications + before and after boluses of TF, pt receives an additional 40 ounce (1183 mL) free water/day. - Recently, pt has not been feeling well so he has only been taking 2 cans of Jevity 1.5 BID which provided 1420 kcal, 60 grams of protein, and 720 mL free water.  - Wife reports that episode of high gastric residual and vomiting large amount of TF from trach has never occurred.  - She states that pt does have reflux and that he requires Reglan 30 minutes prior to all TF boluses.  - He intermittently has some TF-tinged secretions but these are very small in amount.  - PTA pt's PCP had recommended pt be provided with 1 can of Jevity 1.5 five times/day for 1 week to assess if he "felt better."  - Wife and pt report that pt did not notice an overt difference with this.  - Explained that although a difference may not be felt, decreasing TF bolus volume may be beneficial given current dx and the need for resolution of possible (aspiration) PNA.  - Discussed continuing 1 can of Jevity 1.5 five times daily at home home until medical personnel feel that PNA has fully resolved.  - Wife reports that pt's weight has been stable over the past few years.  - No previous weight hx available in the chart.  - Unable to perform physical assessment.   Diet Order:  Diet NPO  time specified Diet - low sodium heart healthy  Skin:  Reviewed, no issues  Last BM:  11/7  Height:   Ht Readings from Last 1 Encounters:  01/28/16 5\' 9"  (1.753 m)    Weight:   Wt Readings from Last 1 Encounters:  01/28/16 158 lb (71.7 kg)    Ideal Body Weight:  72.73 kg  BMI:  Body mass index is 23.33 kg/m.  Estimated Nutritional Needs:   Kcal:  [id number] (22-26 kcal/kg)  Protein:  65-75 grams  Fluid:  >/= 2 L/day  EDUCATION NEEDS:   No education needs identified at this time  Jarome Matin, MS, RD, LDN Inpatient Clinical Dietitian Pager # [phone] After hours/weekend pager # [phone]

## 2016-02-04 DIAGNOSIS — R0902 Hypoxemia: Secondary | ICD-10-CM

## 2016-02-04 DIAGNOSIS — N179 Acute kidney failure, unspecified: Secondary | ICD-10-CM

## 2016-02-04 DIAGNOSIS — G43A0 Cyclical vomiting, not intractable: Secondary | ICD-10-CM

## 2016-02-04 DIAGNOSIS — R339 Retention of urine, unspecified: Secondary | ICD-10-CM

## 2016-02-04 DIAGNOSIS — J189 Pneumonia, unspecified organism: Principal | ICD-10-CM

## 2016-02-04 DIAGNOSIS — K9423 Gastrostomy malfunction: Secondary | ICD-10-CM

## 2016-02-04 LAB — PROCALCITONIN: PROCALCITONIN: 0.4 ng/mL

## 2016-02-04 MED ORDER — LEVOFLOXACIN 750 MG PO TABS: 750.0000 mg | ORAL_TABLET | ORAL | 0 refills | Status: AC

## 2016-02-04 MED ORDER — JEVITY 1.2 CAL PO LIQD
237.0000 mL | ORAL | Status: DC
  Administered 2016-02-04: 237 mL

## 2016-02-04 NOTE — Progress Notes (Signed)
02/04/2016 8:06 AM  Progress Note  Vitals:   02/04/16 0759 02/04/16 0800  BP:  138/67  Pulse:  84  Resp:  20  Temp: 98 F (36.7 C)     Pt was seen and examined.  He feels much better and anxious to go home.  I reviewed pulmonary recommendations.  Pt stable for transfer home.   He says that he will be sure to follow up with his PCP later this week.  His wife and daughter will care for him at home.    General:  Male of normal body habitus in NAD Neuro:  Alert, oriented, non-focal HEENT:  Tracheostomy in place. Bilateral face appears to have skin grafts Cardiovascular:  RRR, no MRG Lungs:  Transmitted upper airway sounds, diminished bases Abdomen:  Soft, non-tender, non-distended Musculoskeletal:  No acute deformity Skin:  Grossly intact    Please see discharge summary.   Murvin Natal, MD

## 2016-02-04 NOTE — Discharge Instructions (Signed)
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

## 2016-02-04 NOTE — Progress Notes (Signed)
PT Cancellation Note  Patient Details Name: Cristian Harding MRN: [id number] DOB: [birth year]   Cancelled Treatment:    Reason Eval/Treat Not Completed: Other (comment) (pt refused PT, stated he's going home soon. )   [name] 02/04/2016, 11:04 AM [phone]

## 2016-02-04 NOTE — Progress Notes (Signed)
Pt refused cpt at this time.  

## 2016-02-05 NOTE — Progress Notes (Signed)
  Progress Note   Date: 02/05/2016  Patient Name: Cristian Harding        MRN#: [id number]   The diagnosis of Healthcare associated pneumonia   was ruled in    Irwin Brakeman, MD

## 2016-02-06 LAB — CULTURE, RESPIRATORY: CULTURE: NORMAL

## 2016-02-06 LAB — CULTURE, RESPIRATORY W GRAM STAIN

## 2017-12-27 DEATH — deceased

## 2018-01-09 IMAGING — CR DG CHEST 2V
2 series · 2 of 2 positions shown · non-contrast
Comparison: None.

CLINICAL DATA: Treated for pneumonia 2 weeks ago, symptoms return
last few days.

EXAM:
CHEST  2 VIEW

[w chest pa]
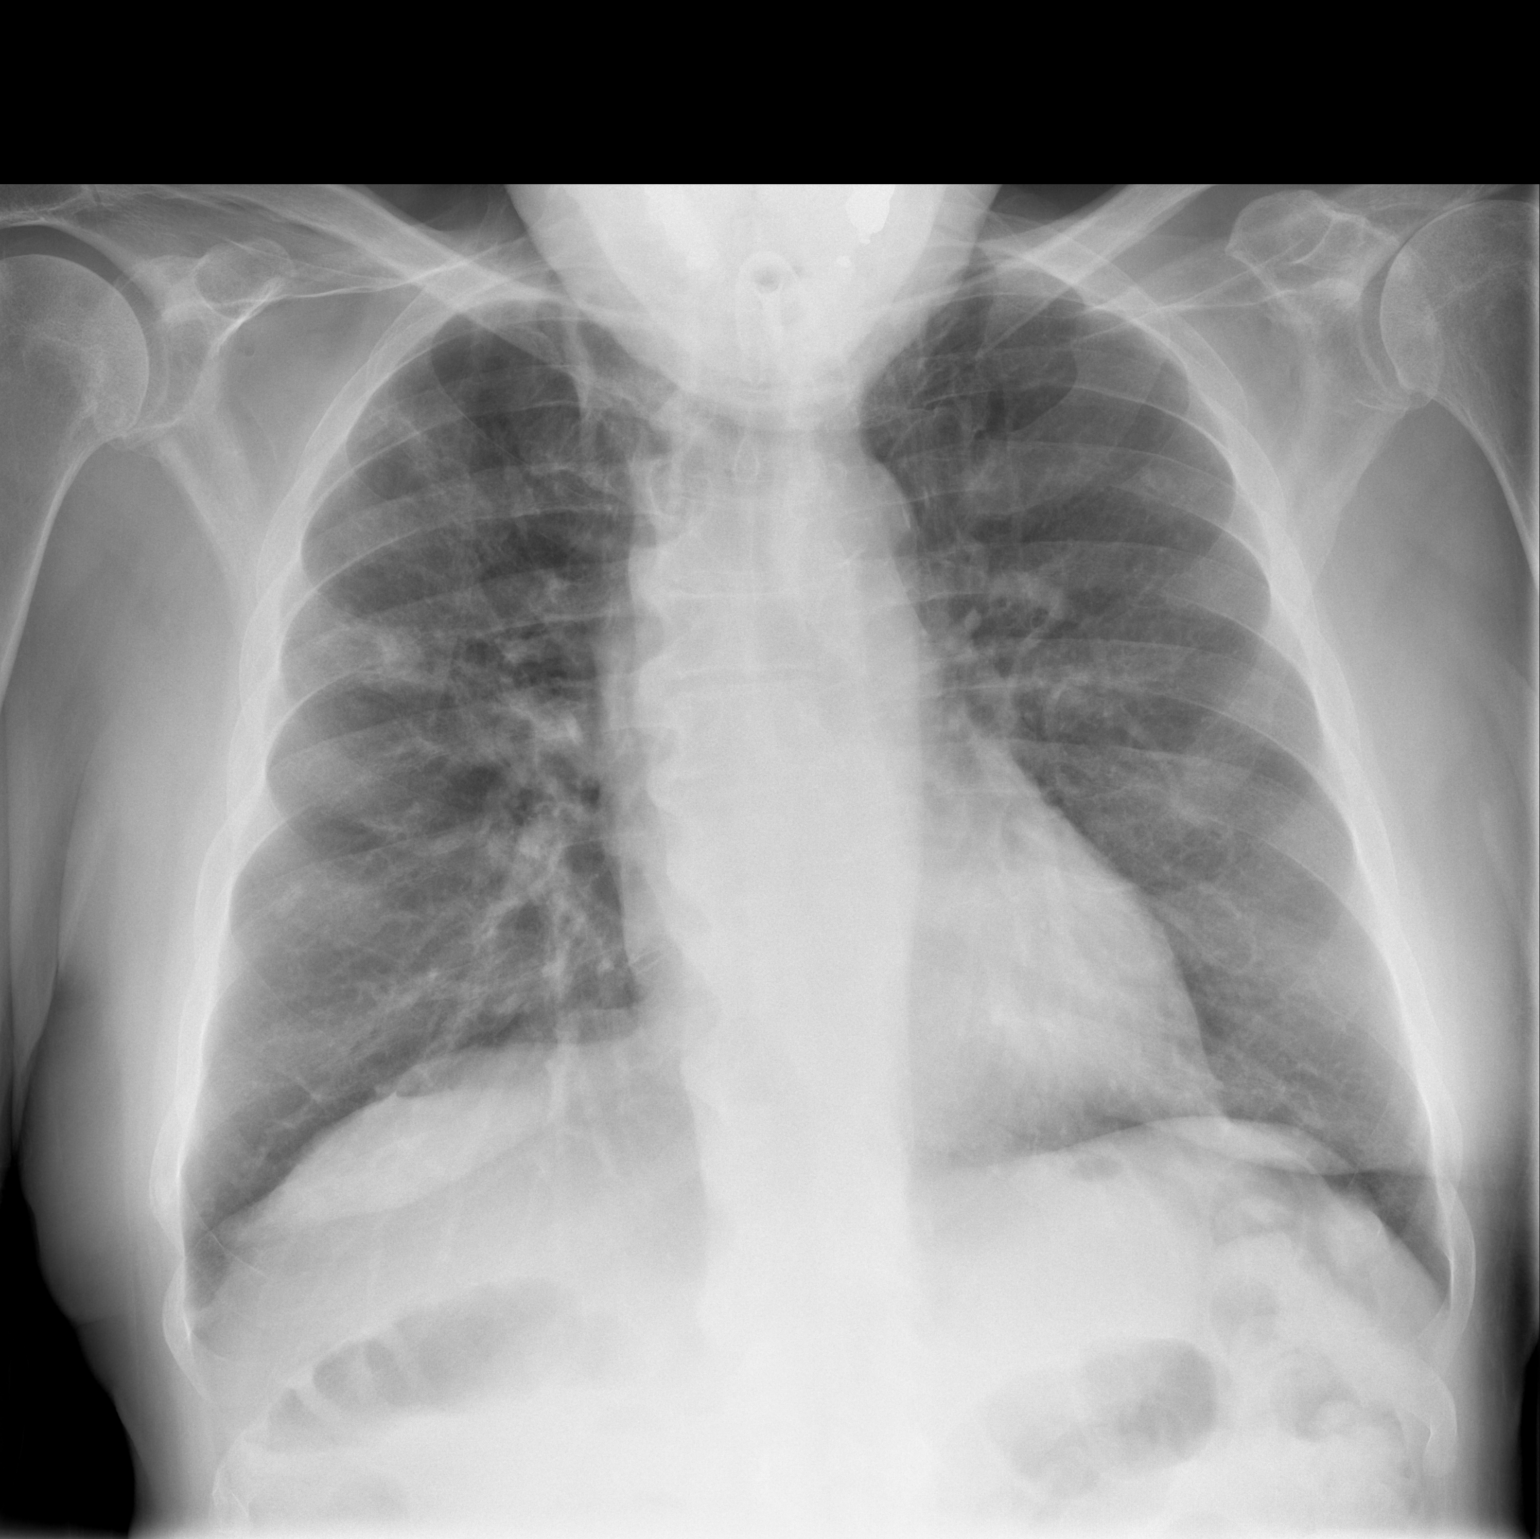

[w chest lat]
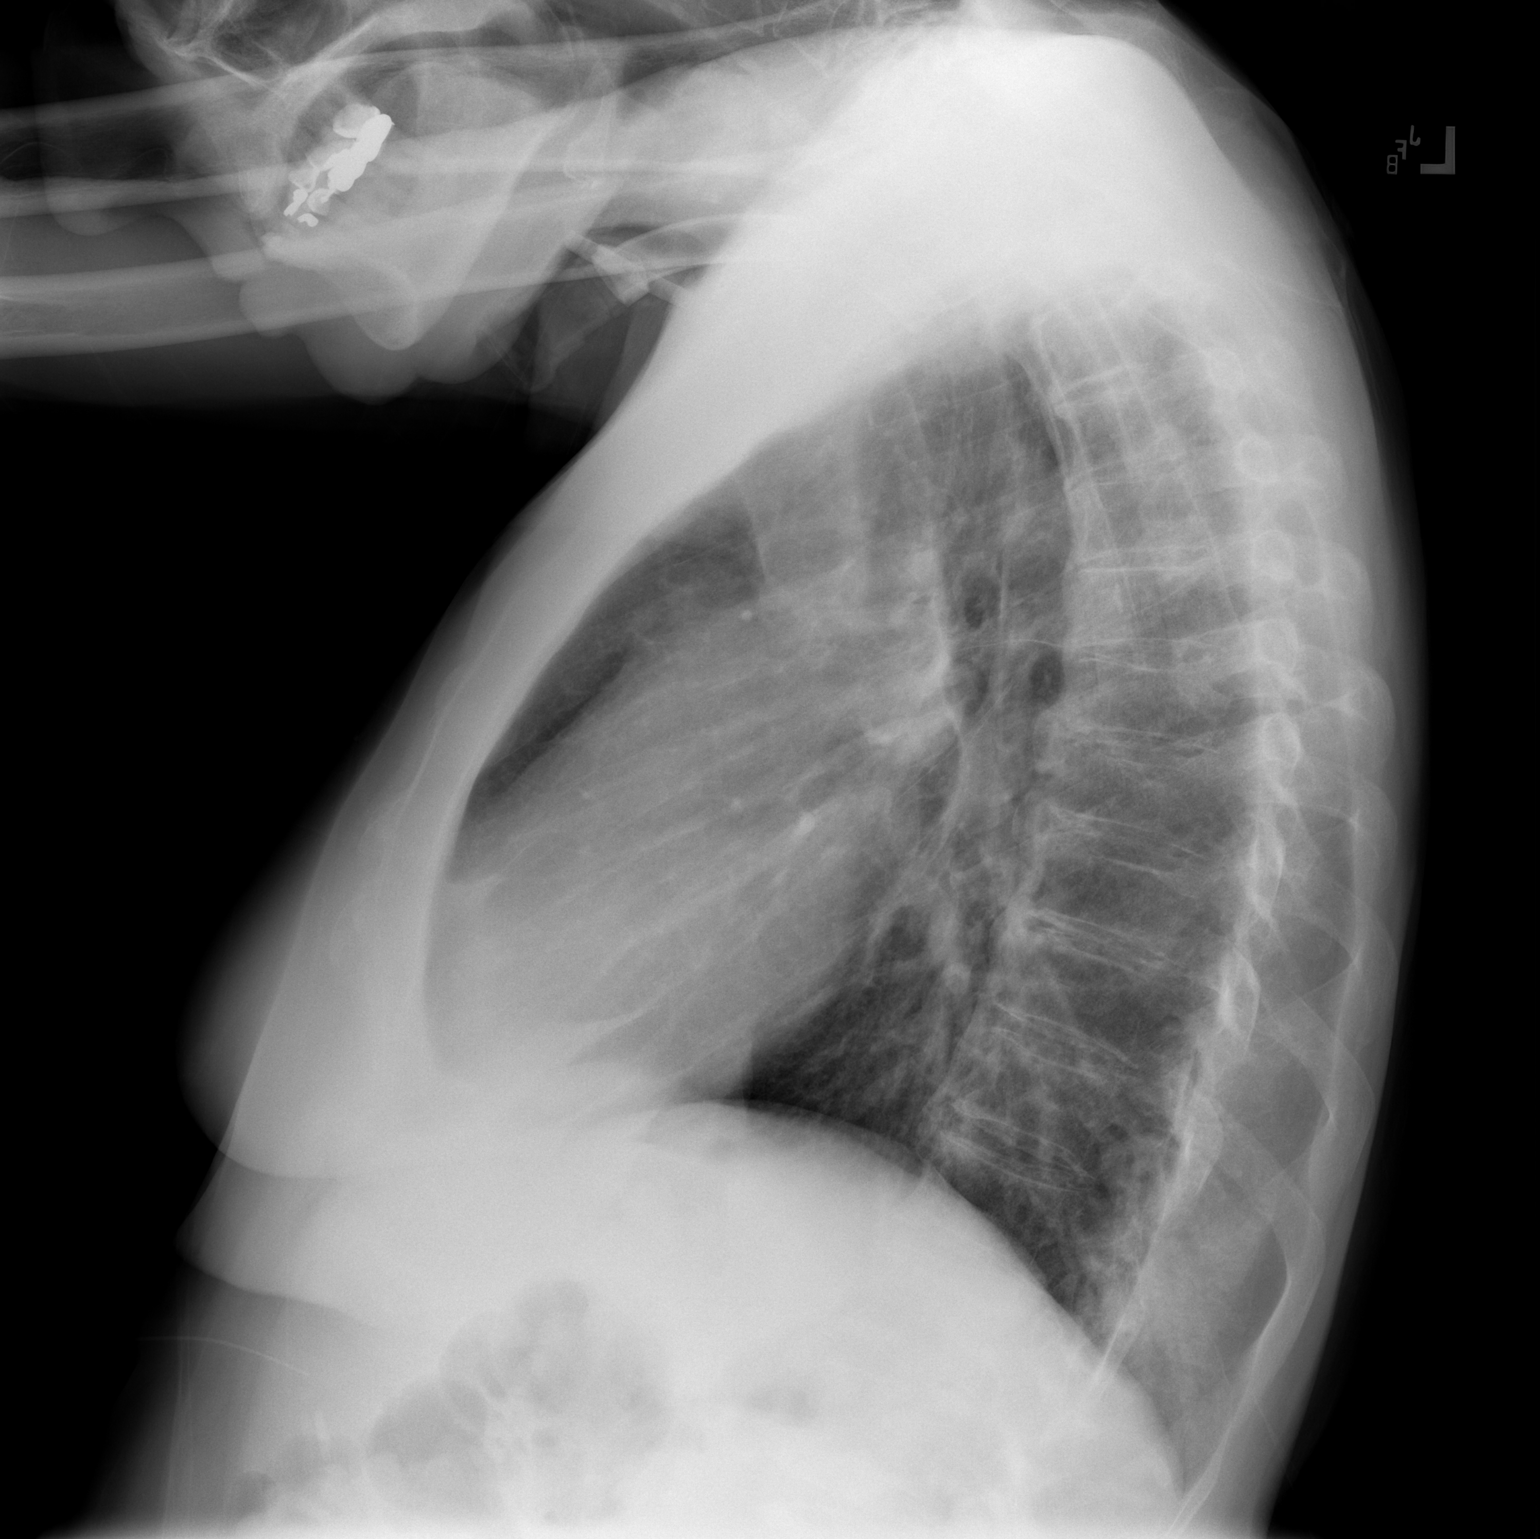

[2 of 2 positions shown; findings below may reference images not displayed]

FINDINGS: Cardiomediastinal silhouette is unremarkable. There is patchy
streaky infiltrate in right upper lobe. Findings suspicious for
recurrent pneumonia. Follow-up to resolution is recommended.
Degenerative changes thoracic spine.
IMPRESSION: There is patchy streaky infiltrate in right upper lobe. Findings
suspicious for recurrent pneumonia. Follow-up to resolution is
recommended.

## 2018-01-20 IMAGING — CR DG CHEST 2V
2 series · 2 of 2 positions shown · non-contrast
Comparison: 01/17/2016

CLINICAL DATA: Pneumonia 2 weeks ago

EXAM:
CHEST  2 VIEW

[w chest pa]
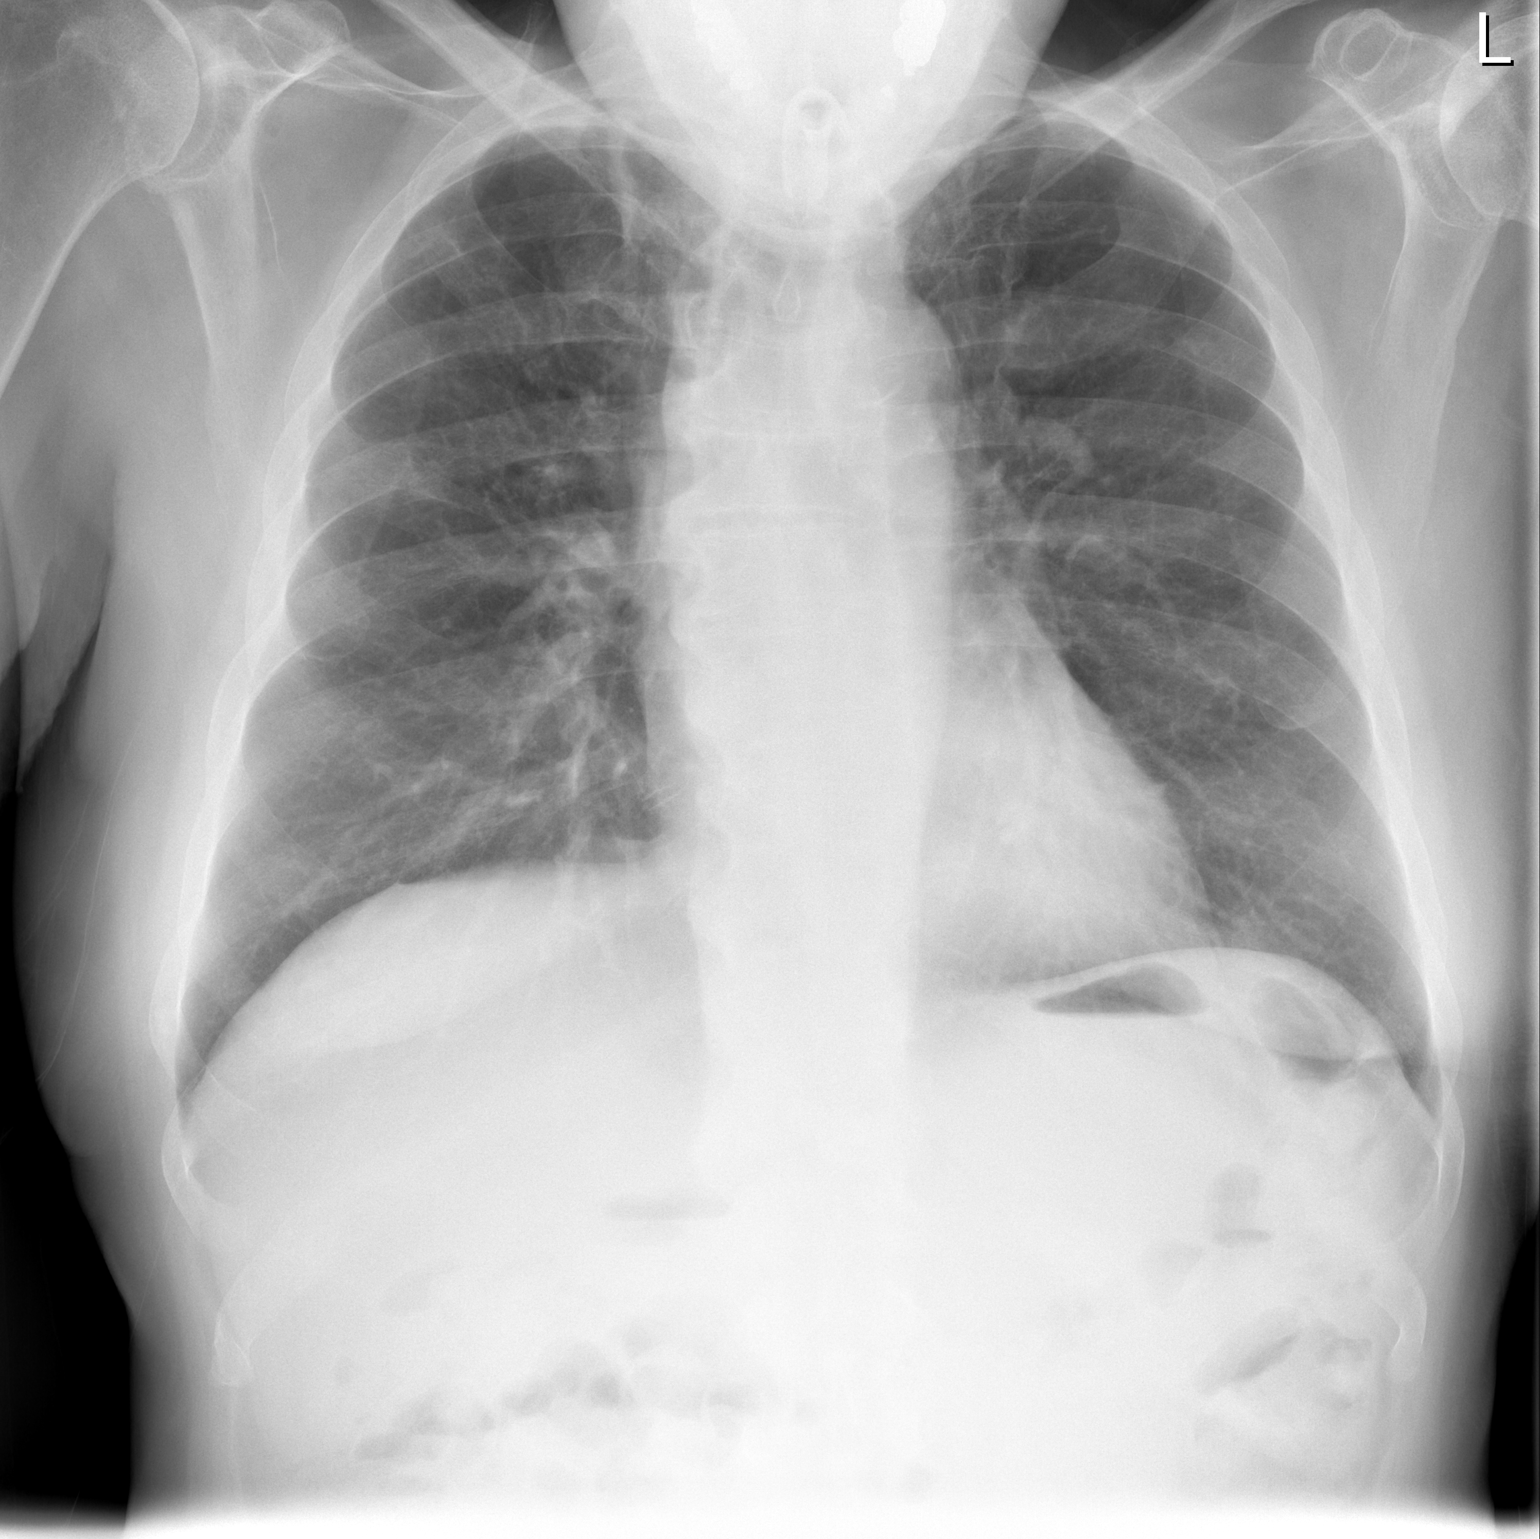

[w chest lat]
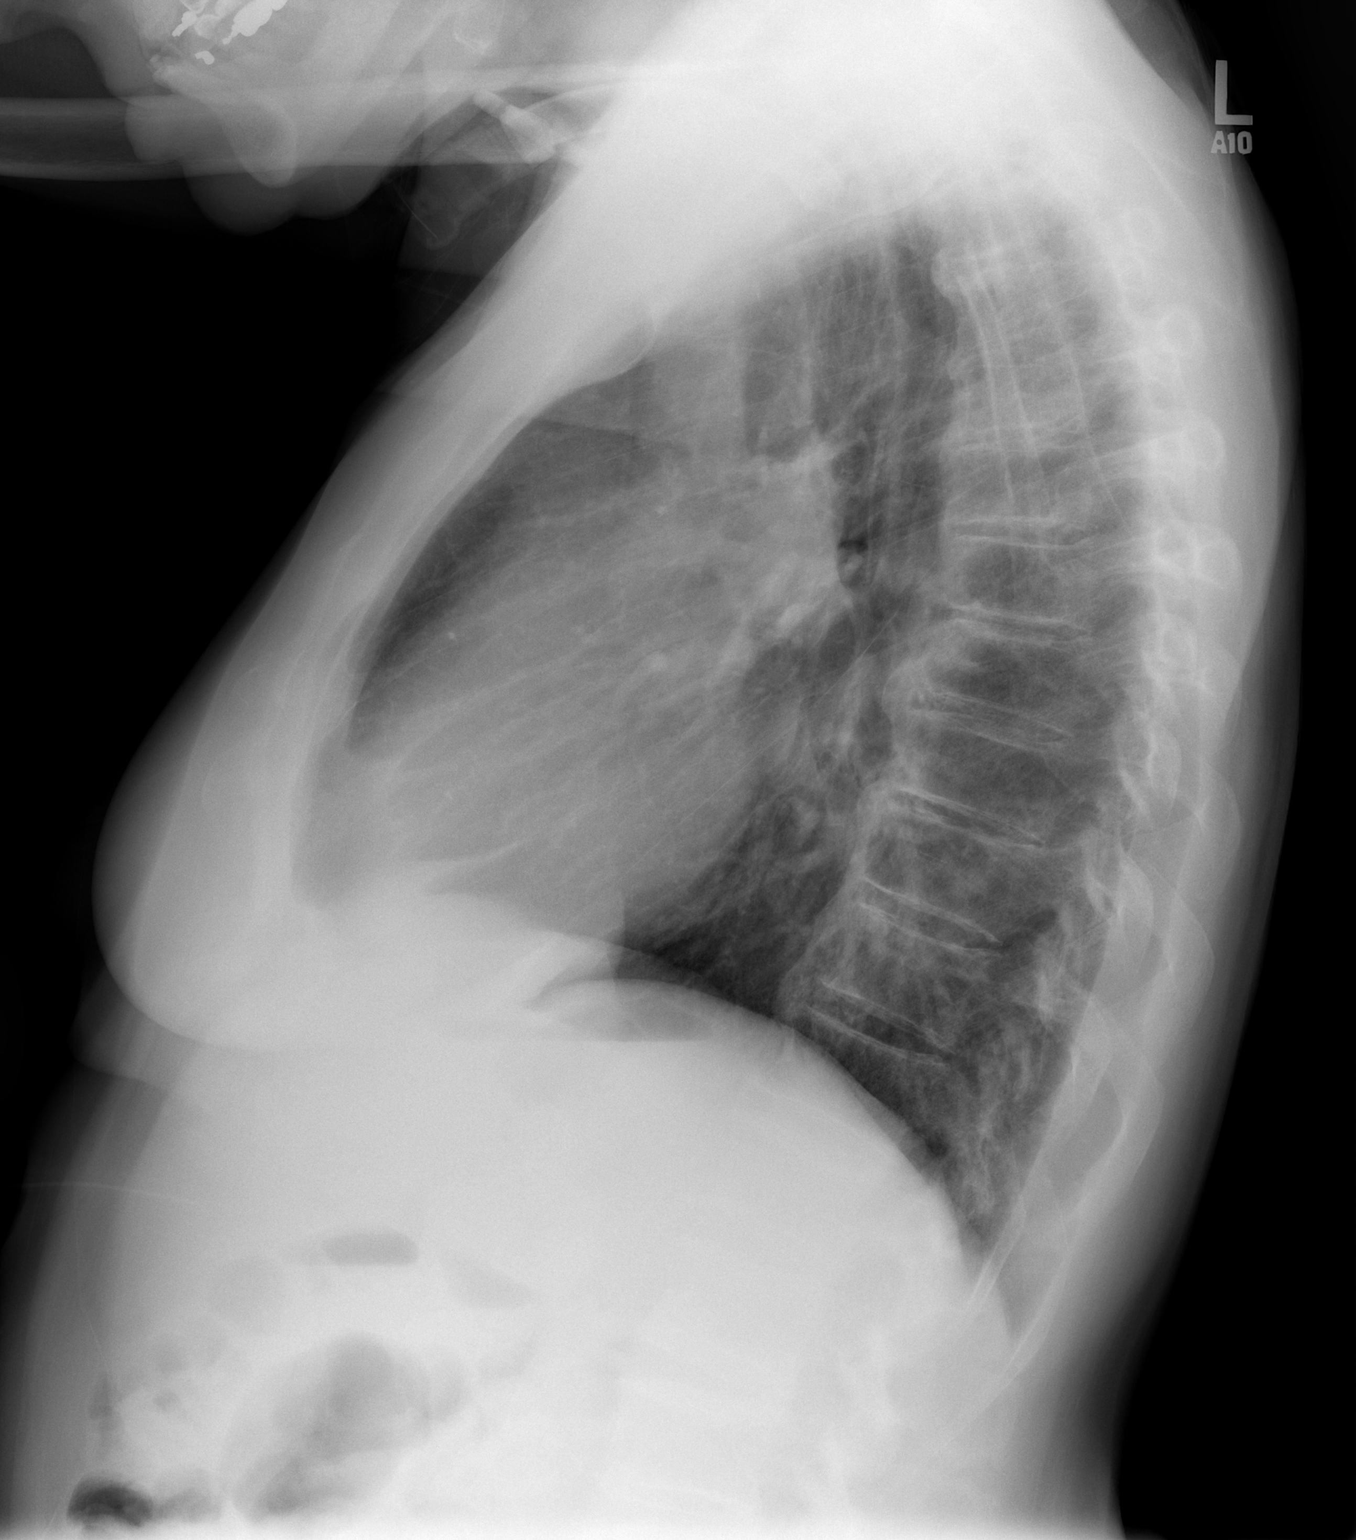

[2 of 2 positions shown; findings below may reference images not displayed]

FINDINGS: Patchy airspace disease in the right upper lobe has nearly resolved.
There is minimal ill-defined opacities remaining. Lungs otherwise
clear. Low volumes. Normal heart size. No pneumothorax or pleural
effusion.
IMPRESSION: Nearly resolved right upper lobe pneumonia. This supports benign
etiology.

## 2018-01-21 IMAGING — DX DG ABDOMEN 2V
2 series · 2 of 2 positions shown · non-contrast
Comparison: None.

CLINICAL DATA: Status post PEG tube placement. Dyspnea. Symptoms
for the last few days. Requiring more frequent suctioning. Cough and
hoarseness. Recent pneumonia. Question of gastric outlet
obstruction.

EXAM:
ABDOMEN - 2 VIEW

[abdomen supine]
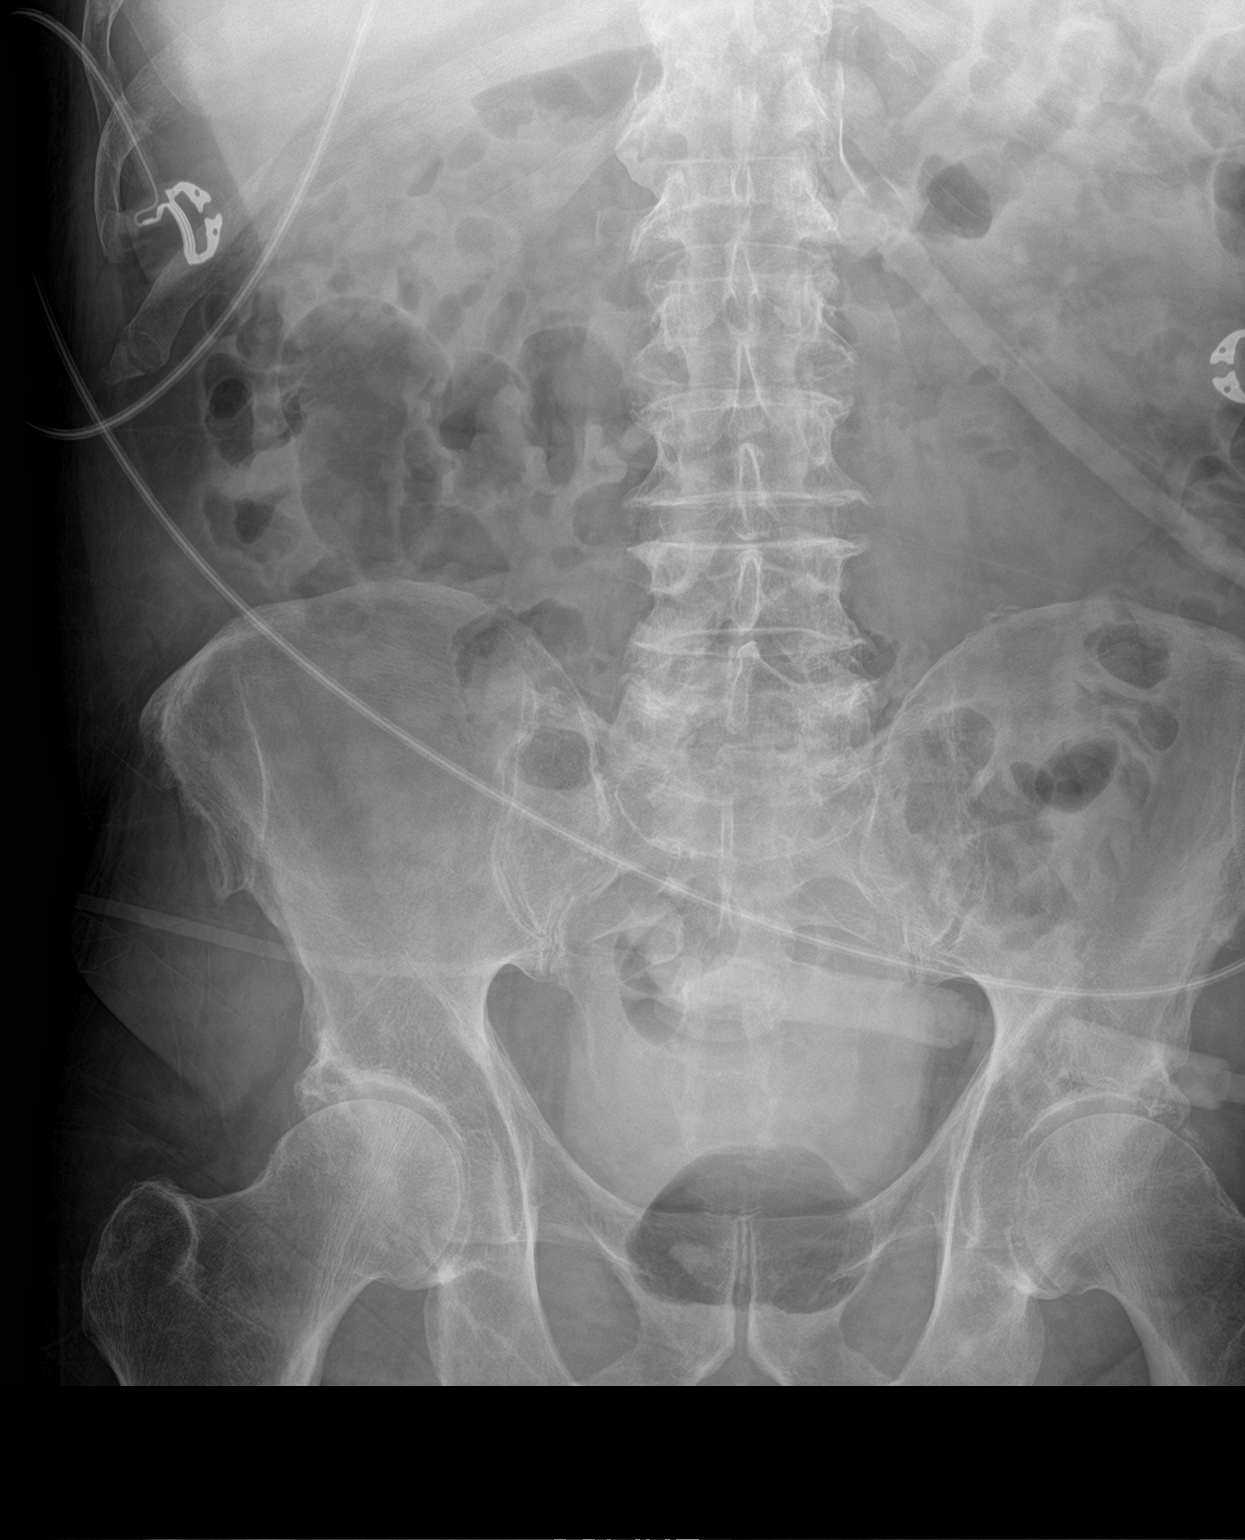

[abdomen decu]
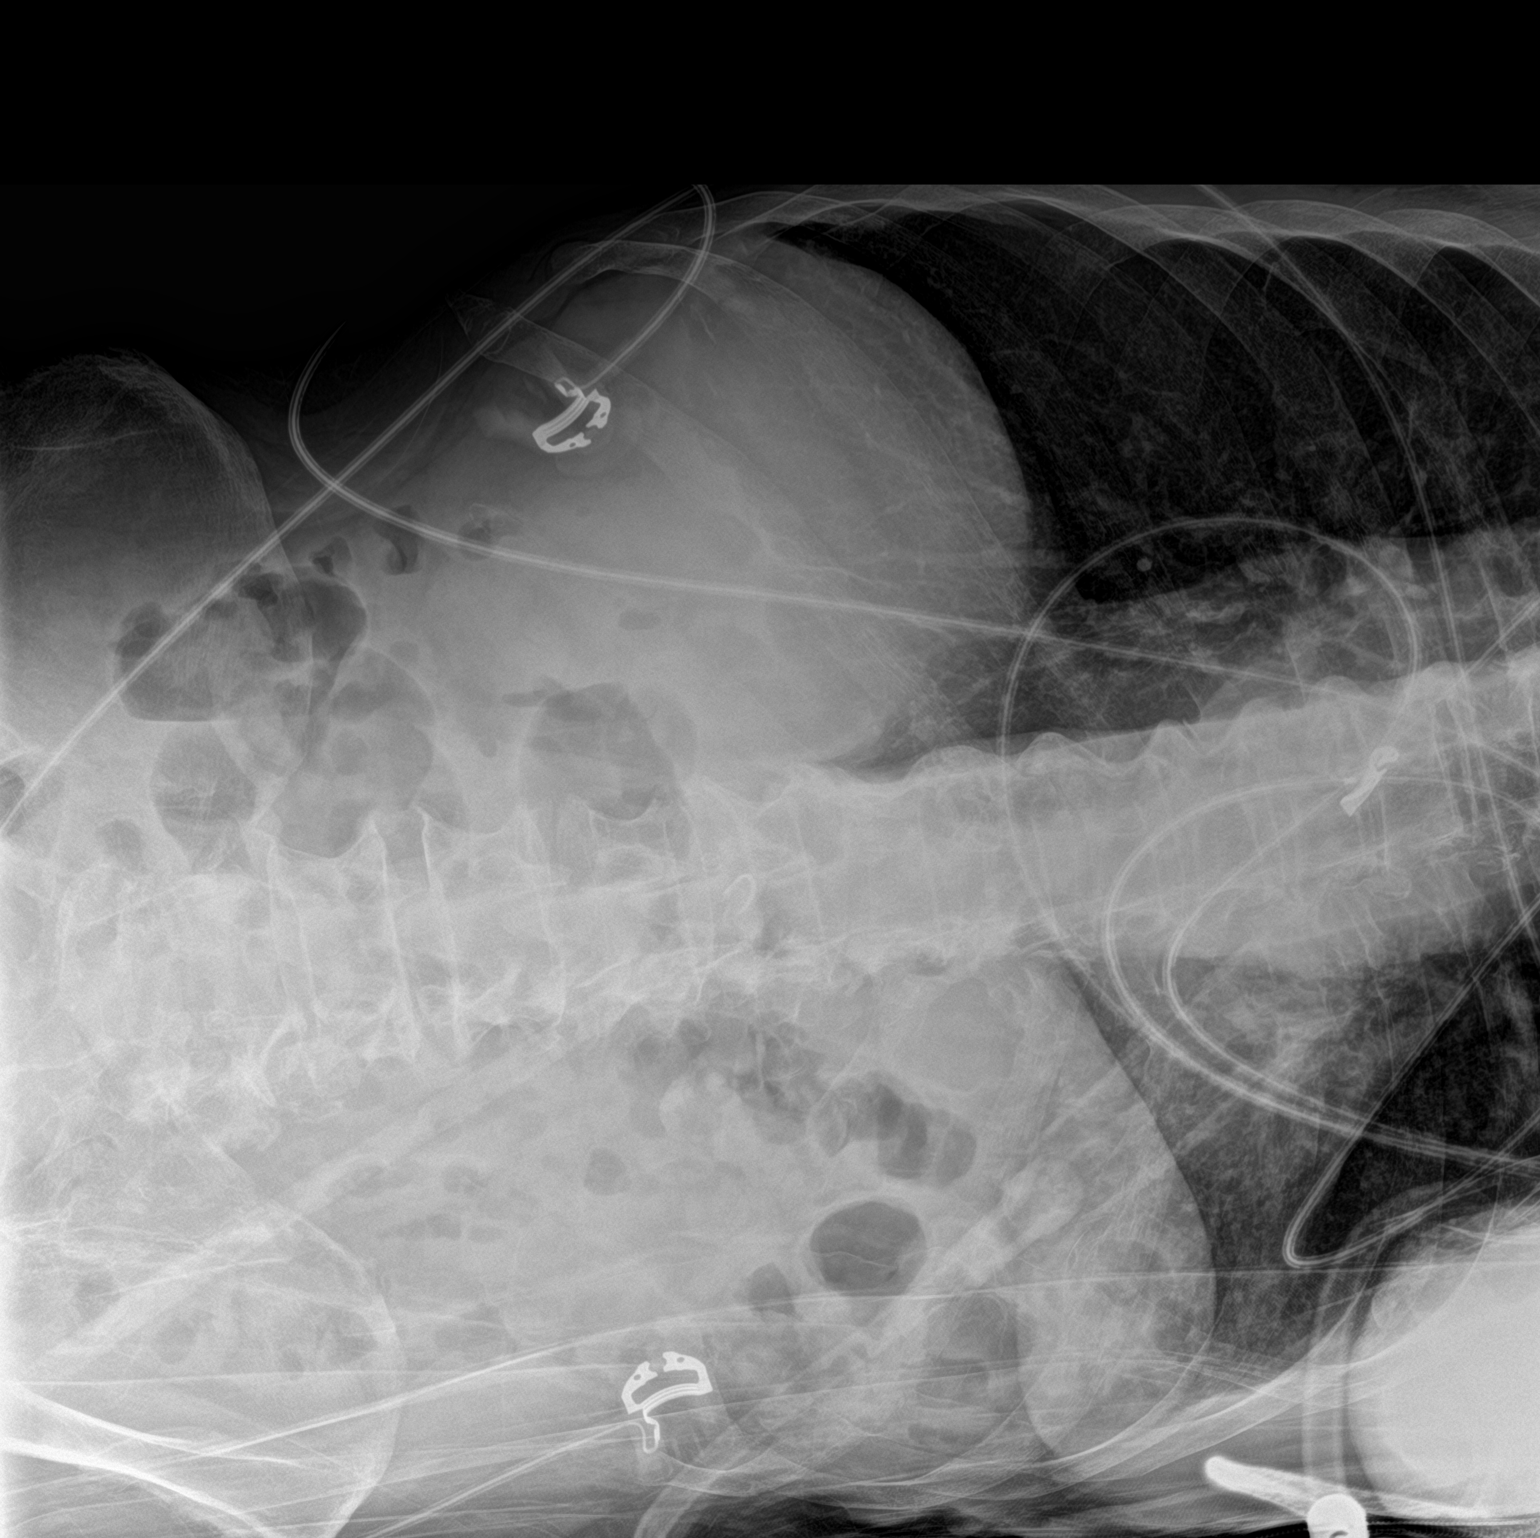

[2 of 2 positions shown; findings below may reference images not displayed]

FINDINGS: No free intraperitoneal air on decubitus view of the abdomen.
Gastrostomy tube overlies the left upper quadrant. Bowel gas pattern
is nonobstructive. Contrast is identified within the urinary bladder
following intravenous contrast given for CT of the chest yesterday.
There is a wedge compression fracture of L1.
IMPRESSION: Nonobstructed bowel-gas pattern.
